# Patient Record
Sex: Female | Born: 1955 | Race: White | Hispanic: No | Marital: Married | Smoking: Former smoker
Health system: Southern US, Community
[De-identification: ages and names within clinical notes are randomized; demographics above are authoritative.]

## PROBLEM LIST (undated history)

## (undated) DIAGNOSIS — K219 Gastro-esophageal reflux disease without esophagitis: Secondary | ICD-10-CM

## (undated) HISTORY — PX: PLACEMENT OF BREAST IMPLANTS: SHX6334

## (undated) HISTORY — PX: AUGMENTATION MAMMAPLASTY: SUR837

## (undated) HISTORY — DX: Gastro-esophageal reflux disease without esophagitis: K21.9

## (undated) HISTORY — PX: REMOVAL OF BILATERAL TISSUE EXPANDERS WITH PLACEMENT OF BILATERAL BREAST IMPLANTS: SHX6431

---

## 1998-10-08 ENCOUNTER — Other Ambulatory Visit: Admission: RE | Admit: 1998-10-08 | Discharge: 1998-10-08 | Payer: Self-pay | Admitting: Family Medicine

## 2001-06-28 ENCOUNTER — Other Ambulatory Visit: Admission: RE | Admit: 2001-06-28 | Discharge: 2001-06-28 | Payer: Self-pay | Admitting: *Deleted

## 2003-02-02 ENCOUNTER — Ambulatory Visit (HOSPITAL_COMMUNITY): Admission: RE | Admit: 2003-02-02 | Discharge: 2003-02-02 | Payer: Self-pay | Admitting: Obstetrics & Gynecology

## 2004-03-24 ENCOUNTER — Other Ambulatory Visit: Admission: RE | Admit: 2004-03-24 | Discharge: 2004-03-24 | Payer: Self-pay | Admitting: Obstetrics & Gynecology

## 2005-04-20 ENCOUNTER — Other Ambulatory Visit: Admission: RE | Admit: 2005-04-20 | Discharge: 2005-04-20 | Payer: Self-pay | Admitting: Obstetrics & Gynecology

## 2006-08-02 ENCOUNTER — Ambulatory Visit: Payer: Self-pay | Admitting: Vascular Surgery

## 2006-08-15 ENCOUNTER — Ambulatory Visit: Payer: Self-pay | Admitting: Gastroenterology

## 2006-08-27 ENCOUNTER — Ambulatory Visit: Payer: Self-pay | Admitting: Gastroenterology

## 2010-04-09 ENCOUNTER — Encounter: Payer: Self-pay | Admitting: Obstetrics & Gynecology

## 2010-06-29 ENCOUNTER — Other Ambulatory Visit: Payer: Self-pay | Admitting: Obstetrics & Gynecology

## 2010-06-29 DIAGNOSIS — N6310 Unspecified lump in the right breast, unspecified quadrant: Secondary | ICD-10-CM

## 2010-07-01 ENCOUNTER — Ambulatory Visit
Admission: RE | Admit: 2010-07-01 | Discharge: 2010-07-01 | Disposition: A | Discharge status: Home / Self Care | Payer: Managed Care, Other (non HMO) | Source: Ambulatory Visit | Attending: Obstetrics & Gynecology | Admitting: Obstetrics & Gynecology

## 2010-07-01 DIAGNOSIS — N6310 Unspecified lump in the right breast, unspecified quadrant: Secondary | ICD-10-CM

## 2010-08-26 ENCOUNTER — Other Ambulatory Visit: Payer: Self-pay | Admitting: Family Medicine

## 2010-08-26 ENCOUNTER — Ambulatory Visit
Admission: RE | Admit: 2010-08-26 | Discharge: 2010-08-26 | Disposition: A | Discharge status: Home / Self Care | Payer: Managed Care, Other (non HMO) | Source: Ambulatory Visit | Attending: Family Medicine | Admitting: Family Medicine

## 2010-08-26 DIAGNOSIS — M79672 Pain in left foot: Secondary | ICD-10-CM

## 2010-11-15 ENCOUNTER — Other Ambulatory Visit: Payer: Self-pay | Admitting: Family Medicine

## 2010-11-16 ENCOUNTER — Ambulatory Visit
Admission: RE | Admit: 2010-11-16 | Discharge: 2010-11-16 | Disposition: A | Discharge status: Home / Self Care | Payer: Managed Care, Other (non HMO) | Source: Ambulatory Visit | Attending: Family Medicine | Admitting: Family Medicine

## 2010-11-29 ENCOUNTER — Encounter (INDEPENDENT_AMBULATORY_CARE_PROVIDER_SITE_OTHER): Payer: Self-pay | Admitting: General Surgery

## 2010-12-01 ENCOUNTER — Encounter (INDEPENDENT_AMBULATORY_CARE_PROVIDER_SITE_OTHER): Payer: Self-pay | Admitting: General Surgery

## 2010-12-01 ENCOUNTER — Ambulatory Visit (INDEPENDENT_AMBULATORY_CARE_PROVIDER_SITE_OTHER): Payer: Commercial Indemnity | Admitting: General Surgery

## 2010-12-01 VITALS — BP 106/78 | HR 60 | Temp 97.3°F | Ht 63.0 in | Wt 111.4 lb

## 2010-12-01 DIAGNOSIS — K8020 Calculus of gallbladder without cholecystitis without obstruction: Secondary | ICD-10-CM

## 2010-12-01 NOTE — Progress Notes (Signed)
Chief Complaint  Patient presents with  . Other    new pt- eval of GB with possible polyp or stone     HPI Sarah Haas is a 55 y.o. female.  This patient was referred by Dr. Paulino Rily for evaluation of her quadrant pain and an ultrasound which demonstrated a 6 mm polyp or gallstone. She states that she was feeling fine and usual state of health until approximately 3 weeks ago when she had an acute episode of right upper quadrant pain. She had an episode of discomfort beginning immediately after eating [employer] with chips and she began having pain in her right lateral abdomen and ribs which lasted approximately 5 hours, no radiation. She states that her she did not have any associated nausea, vomiting, fevers, or chills. She was able to finally go to sleep at approximately 2:30 in the morning when she woke up her pain was better. She was seen in followup by her primary physician who ordered an ultrasound and laboratory studies her labs were normal but ultrasound demonstrated a 6 mm polyp versus an adherent gallstone.  HPI  Past Medical History  Diagnosis Date  . Abdominal pain     History reviewed. No pertinent past surgical history.  History reviewed. No pertinent family history.  Social History History  Substance Use Topics  . Smoking status: Former Games developer  . Smokeless tobacco: Not on file   Comment: quit in 2002  . Alcohol Use: Yes    No Known Allergies  Current Outpatient Prescriptions  Medication Sig Dispense Refill  . aspirin 81 MG tablet Take 81 mg by mouth daily.        . calcium carbonate (OS-CAL) 600 MG TABS Take 600 mg by mouth 2 (two) times daily with a meal.        . Multiple Vitamin (MULTIVITAMIN) capsule Take 1 capsule by mouth daily.        . Omega-3 Fatty Acids (FISH OIL) 1200 MG CAPS Take by mouth.          Review of Systems Review of Systems  All other systems reviewed and are negative.    Blood pressure 106/78, pulse 60, temperature 97.3 F (36.3 C),  height 5\' 3"  (1.6 m), weight 111 lb 6 oz (50.519 kg).  Physical Exam Physical Exam  Vitals reviewed. Constitutional: She is oriented to person, place, and time. She appears well-developed and well-nourished. No distress.  HENT:  Head: Normocephalic and atraumatic.  Eyes: Conjunctivae and EOM are normal. Pupils are equal, round, and reactive to light. Right eye exhibits no discharge. Left eye exhibits no discharge. No scleral icterus.  Neck: Normal range of motion. Neck supple. No tracheal deviation present.  Cardiovascular: Normal rate, regular rhythm and normal heart sounds.   Pulmonary/Chest: Effort normal and breath sounds normal. No stridor. No respiratory distress. She has no wheezes.  Abdominal: Soft. Bowel sounds are normal. She exhibits no distension and no mass. There is no tenderness. There is no rebound and no guarding.  Musculoskeletal: Normal range of motion. She exhibits no edema.  Neurological: She is alert and oriented to person, place, and time.  Skin: Skin is warm and dry. No rash noted. She is not diaphoretic. No erythema. No pallor.  Psychiatric: She has a normal mood and affect. Her behavior is normal. Judgment and thought content normal.    Data Reviewed   Assessment/Plan    Abdominal pain and a gallbladder polyp versus cholelithiasis. Her symptoms certainly do sound as though they could be  attributed to cholelithiasis.  This may be a polyp or a gallstone. If it is a polyp, then at 6 mm in size, it would not require surgical excision at this time since the likelihood of malignancy is low. More commonly, this would be a nonmobile gallstone. I offered laparoscopic cholecystectomy, single incision cholecystectomy, or continued observation. We did discuss the risks of cholecystitis, cholangitis, and pancreatitis and she expressed understanding. She is not sure if she would like to have surgical removal at this time and I gave her some information about gallbladder surgery  and she will take about this and discuss with her husband and she will call me back if she desires to proceed with cholecystectomy. If she desires not to have surgery at this time, then I would recommend repeat ultrasound in 6 months to ensure stability of the gallbladder polyp. If there is growth of the polyp at 6 months time, then I would consider cholecystectomy for diagnosis of that.               Lodema Pilot DAVID 12/01/2010, 5:46 PM

## 2010-12-28 ENCOUNTER — Other Ambulatory Visit: Payer: Self-pay | Admitting: Obstetrics & Gynecology

## 2012-02-13 ENCOUNTER — Ambulatory Visit: Payer: Self-pay | Admitting: Gastroenterology

## 2012-03-01 ENCOUNTER — Other Ambulatory Visit: Payer: Self-pay | Admitting: Gastroenterology

## 2012-03-01 ENCOUNTER — Ambulatory Visit (INDEPENDENT_AMBULATORY_CARE_PROVIDER_SITE_OTHER): Payer: 59 | Admitting: Gastroenterology

## 2012-03-01 ENCOUNTER — Encounter: Payer: Self-pay | Admitting: Gastroenterology

## 2012-03-01 ENCOUNTER — Other Ambulatory Visit (INDEPENDENT_AMBULATORY_CARE_PROVIDER_SITE_OTHER): Payer: 59

## 2012-03-01 VITALS — BP 92/62 | HR 78 | Ht 63.5 in | Wt 109.0 lb

## 2012-03-01 DIAGNOSIS — R1319 Other dysphagia: Secondary | ICD-10-CM

## 2012-03-01 DIAGNOSIS — K3189 Other diseases of stomach and duodenum: Secondary | ICD-10-CM

## 2012-03-01 DIAGNOSIS — R1314 Dysphagia, pharyngoesophageal phase: Secondary | ICD-10-CM

## 2012-03-01 DIAGNOSIS — R109 Unspecified abdominal pain: Secondary | ICD-10-CM

## 2012-03-01 DIAGNOSIS — R195 Other fecal abnormalities: Secondary | ICD-10-CM

## 2012-03-01 DIAGNOSIS — K30 Functional dyspepsia: Secondary | ICD-10-CM

## 2012-03-01 DIAGNOSIS — K219 Gastro-esophageal reflux disease without esophagitis: Secondary | ICD-10-CM

## 2012-03-01 DIAGNOSIS — R1310 Dysphagia, unspecified: Secondary | ICD-10-CM

## 2012-03-01 DIAGNOSIS — R1012 Left upper quadrant pain: Secondary | ICD-10-CM

## 2012-03-01 LAB — TSH: TSH: 2.45 u[IU]/mL (ref 0.35–5.50)

## 2012-03-01 LAB — CBC WITH DIFFERENTIAL/PLATELET
Basophils Absolute: 0 10*3/uL (ref 0.0–0.1)
Basophils Relative: 1.2 % (ref 0.0–3.0)
Eosinophils Absolute: 0.1 10*3/uL (ref 0.0–0.7)
Eosinophils Relative: 3.7 % (ref 0.0–5.0)
HCT: 40.8 % (ref 36.0–46.0)
Hemoglobin: 14.1 g/dL (ref 12.0–15.0)
Lymphocytes Relative: 40.9 % (ref 12.0–46.0)
Lymphs Abs: 1.6 10*3/uL (ref 0.7–4.0)
MCHC: 34.6 g/dL (ref 30.0–36.0)
MCV: 93.6 fl (ref 78.0–100.0)
Monocytes Absolute: 0.4 10*3/uL (ref 0.1–1.0)
Monocytes Relative: 11.2 % (ref 3.0–12.0)
Neutro Abs: 1.7 10*3/uL (ref 1.4–7.7)
Neutrophils Relative %: 43 % (ref 43.0–77.0)
Platelets: 274 10*3/uL (ref 150.0–400.0)
RBC: 4.35 Mil/uL (ref 3.87–5.11)
RDW: 12.9 % (ref 11.5–14.6)
WBC: 3.9 10*3/uL — ABNORMAL LOW (ref 4.5–10.5)

## 2012-03-01 LAB — BASIC METABOLIC PANEL
BUN: 22 mg/dL (ref 6–23)
CO2: 28 mEq/L (ref 19–32)
Calcium: 9.5 mg/dL (ref 8.4–10.5)
Chloride: 104 mEq/L (ref 96–112)
Creatinine, Ser: 0.7 mg/dL (ref 0.4–1.2)
GFR: 91.97 mL/min (ref >60.00)
Glucose, Bld: 79 mg/dL (ref 70–99)
Potassium: 4.5 mEq/L (ref 3.5–5.1)
Sodium: 138 mEq/L (ref 135–145)

## 2012-03-01 LAB — HEPATIC FUNCTION PANEL
ALT: 20 U/L (ref 0–35)
AST: 22 U/L (ref 0–37)
Albumin: 4.3 g/dL (ref 3.5–5.2)
Alkaline Phosphatase: 71 U/L (ref 39–117)
Bilirubin, Direct: 0.1 mg/dL (ref 0.0–0.3)
Total Bilirubin: 0.7 mg/dL (ref 0.3–1.2)
Total Protein: 6.9 g/dL (ref 6.0–8.3)

## 2012-03-01 LAB — IBC PANEL
Iron: 268 ug/dL — ABNORMAL HIGH (ref 42–145)
Saturation Ratios: 53 % — ABNORMAL HIGH (ref 20.0–50.0)
Transferrin: 361.1 mg/dL — ABNORMAL HIGH (ref 212.0–360.0)

## 2012-03-01 LAB — FOLATE: Folate: >24.8 ng/mL (ref >5.9)

## 2012-03-01 LAB — VITAMIN B12: Vitamin B-12: 674 pg/mL (ref 211–911)

## 2012-03-01 LAB — FERRITIN: Ferritin: 14.2 ng/mL (ref 10.0–291.0)

## 2012-03-01 MED ORDER — ESOMEPRAZOLE MAGNESIUM 40 MG PO CPDR: 40.0000 mg | DELAYED_RELEASE_CAPSULE | Freq: Every day | ORAL | Status: DC

## 2012-03-01 MED ORDER — HYOSCYAMINE SULFATE 0.125 MG SL SUBL: 0.1250 mg | SUBLINGUAL_TABLET | SUBLINGUAL | Status: DC | PRN

## 2012-03-01 MED ORDER — MOVIPREP 100 G PO SOLR: 1.0000 | Freq: Once | ORAL | Status: DC

## 2012-03-01 NOTE — Progress Notes (Signed)
History of Present Illness:  This is a very pleasant 56 year old Caucasian female who has had guaiac positive stools on Hemoccult card testing.  She denies any GI complaints has regular bowel movements without melena or hematochezia.  Last colonoscopy was unremarkable in June of 2008.  The patient does have chronic dyspepsia, some reflux symptoms, occasional intermittent solid food dysphagia.  She has not had previous endoscopic exam or upper GI series.  She has been told in the past that she had nonspecific dyspepsia, and apparently this is exacerbated by stressful conditions.  IBS seems to run her family.  She also complains of intermittent left upper quadrant pain may be one to 2 times a week without real precipitating or alleviating elements.  She apparently has known gallstones, but denies any hepatobiliary or systemic complaints.  There is no history of NSAID, cigarette, or alcohol abuse.  There is no past history of hepatitis or pancreatitis.  She does work as an Chief Executive Officer.  I have reviewed notes from Julio Sicks, [facility] Practitioner [facility].  The patient has had recent normal pelvic examination that was reviewed.  Ultrasound exam from last year also reviewed today.  I have reviewed this patient's present history, medical and surgical past history, allergies and medications.     ROS: The remainder of the 10 point ROS is negative.... no history of Raynaud's phenomena, or any other symptoms of collagen vascular disease.  No Known Allergies Outpatient Prescriptions Prior to Visit  Medication Sig Dispense Refill  . calcium carbonate (OS-CAL) 600 MG TABS Take 600 mg by mouth 2 (two) times daily with a meal.        . Multiple Vitamin (MULTIVITAMIN) capsule Take 1 capsule by mouth daily.        . Omega-3 Fatty Acids (FISH OIL) 1200 MG CAPS Take by mouth.        . [DISCONTINUED] aspirin 81 MG tablet Take 81 mg by mouth daily.         Last reviewed on 03/01/2012  11:17 AM by Mardella Layman, MD Past Medical History  Diagnosis Date  . Abdominal pain   . GERD (gastroesophageal reflux disease)    Past Surgical History  Procedure Date  . Cesarean section    History   Social History  . Marital Status: Married    Spouse Name: N/A    Number of Children: 2  . Years of Education: N/A   Occupational History  . orthodonic assistant    Social History Main Topics  . Smoking status: Former Smoker    Types: Cigarettes    Quit date: 03/20/2001  . Smokeless tobacco: Never Used     Comment: quit in 2002  . Alcohol Use: Yes     Comment: social  . Drug Use: No  . Sexually Active: None   Other Topics Concern  . None   Social History Narrative  . None   Family History  Problem Relation Age of Onset  . Heart disease Father         Physical Exam: Healthy patient in no distress.  Blood pressure 92/62, pulse 68 and regular, weight 109 pounds with a BMI of 19.01.  98% oxygen saturation at room air. General well developed well nourished patient in no acute distress, appearing their stated age Eyes PERRLA, no icterus, fundoscopic exam per opthamologist Skin no lesions noted Neck supple, no adenopathy, no thyroid enlargement, no tenderness Chest clear to percussion and auscultation Heart no significant murmurs,  gallops or rubs noted Abdomen no hepatosplenomegaly masses or tenderness, BS normal.  Extremities no acute joint lesions, edema, phlebitis or evidence of cellulitis. Neurologic patient oriented x 3, cranial nerves intact, no focal neurologic deficits noted. Psychological mental status normal and normal affect.  Assessment and plan: Review of this patient's ultrasound shows a probable gallbladder polyp.  She certainly does not seem to have symptomatic cholelithiasis at this time.  She does have acid reflux symptoms and a possible esophageal stricture with intermittent dysphagia.  I have scheduled endoscopy with possible dilatation, also  exam for H. pylori infection.  I have placed her on Nexium 40 mg a day 30 minutes before the first meal of the day with when necessary sublingual Levsin for left upper quadrant spasms.  I am concerned about her guaiac positive stool, and also we'll recheck her colonoscopy exam with propofol sedation.  CBC and anemia profile labs drawn.   Cc Julio Sicks, nurse practitioner  Encounter Diagnoses  Name Primary?  . GERD (gastroesophageal reflux disease) Yes  . Heme positive stool   . Abdominal pain

## 2012-03-01 NOTE — Patient Instructions (Addendum)
You have been scheduled for an endoscopy and colonoscopy with propofol. Please follow the written instructions given to you at your visit today. Please pick up your prep at the pharmacy within the next 1-3 days. If you use inhalers (even only as needed) or a CPAP machine, please bring them with you on the day of your procedure.  We have sent the following medications to your pharmacy for you to pick up at your convenience: Nexium, please take one capsule by mouth thirty minutes before breakfast daily. AND Levsin, please take as directed.  Your physician has requested that you go to the basement for lab work before leaving today:

## 2012-03-04 LAB — CELIAC PANEL 10
Endomysial Screen: NEGATIVE
Gliadin IgA: 2 U/mL (ref <20)
Gliadin IgG: 2.3 U/mL (ref <20)
IgA: 74 mg/dL (ref 69–380)
Tissue Transglut Ab: 4.3 U/mL (ref <20)
Tissue Transglutaminase Ab, IgA: 1.3 U/mL (ref <20)

## 2012-03-18 ENCOUNTER — Ambulatory Visit (AMBULATORY_SURGERY_CENTER): Payer: 59 | Admitting: Gastroenterology

## 2012-03-18 ENCOUNTER — Other Ambulatory Visit: Payer: 59

## 2012-03-18 ENCOUNTER — Encounter: Payer: Self-pay | Admitting: Gastroenterology

## 2012-03-18 ENCOUNTER — Other Ambulatory Visit: Payer: Self-pay | Admitting: *Deleted

## 2012-03-18 VITALS — BP 103/67 | HR 58 | Temp 99.0°F | Resp 15 | Ht 63.0 in | Wt 109.0 lb

## 2012-03-18 DIAGNOSIS — R195 Other fecal abnormalities: Secondary | ICD-10-CM

## 2012-03-18 DIAGNOSIS — R1314 Dysphagia, pharyngoesophageal phase: Secondary | ICD-10-CM

## 2012-03-18 DIAGNOSIS — R1012 Left upper quadrant pain: Secondary | ICD-10-CM

## 2012-03-18 MED ORDER — SODIUM CHLORIDE 0.9 % IV SOLN: 500.0000 mL | INTRAVENOUS | Status: DC

## 2012-03-18 NOTE — Op Note (Signed)
[facility] [address] [place], [address]   ENDOSCOPY PROCEDURE REPORT  PATIENT: Sarah, Haas  MR#: [id number] BIRTHDATE: [birth year] , 56  yrs. old GENDER: Female ENDOSCOPIST:Dymphna Wadley Hale Bogus, MD, [facility] REFERRED BY: Roderick Pee, M.D. PROCEDURE DATE:  03/18/2012 PROCEDURE:   EGD, diagnostic ASA CLASS:    Class II INDICATIONS: Occult blood positive. MEDICATION: There was residual sedation effect present from prior procedure, Propofol (Diprivan) 150 mg IV, and Glycopyrrolate (Robinul) 0.2 mg IV TOPICAL ANESTHETIC:   Cetacaine Spray  DESCRIPTION OF PROCEDURE:   After the risks and benefits of the procedure were explained, informed consent was obtained.  The FUSE Demo Scope  endoscope was introduced through the mouth  and advanced to the second portion of the duodenum .  The instrument was slowly withdrawn as the mucosa was fully examined.No heme noted in UGI tract.    Images taken but only available in hard copy form that will be scanned into EP[employer]).    Retroflexed views revealed no abnormalities.    The scope was then withdrawn from the patient and the procedure completed.  COMPLICATIONS: There were no complications.   ENDOSCOPIC IMPRESSION:NORMAL EXAM..Marland Kitchen?? FALSE + STOO;  RECOMMENDATIONS:CONSIDER PILL CAMERA EXAM..Marland KitchenIRON STUDIES SHOW ELEVATED LEVELS.Marland KitchenWILL DO HEMACHROMATOSIS GENETIC STUDIES... Continue current medications    _______________________________ eSigned:  Mardella Layman, MD, [facility] 03/18/2012 4:24 PM   standard discharge

## 2012-03-18 NOTE — Op Note (Signed)
[facility] [address] [place], [address]   COLONOSCOPY PROCEDURE REPORT  PATIENT: Sarah Haas, Sarah Haas  MR#: [id number] BIRTHDATE: [birth year] , 56  yrs. old GENDER: Female ENDOSCOPIST: Mardella Layman, MD, [facility] REFERRED BY: PROCEDURE DATE:  03/18/2012 PROCEDURE:   Colonoscopy, screening ASA CLASS:   Class II INDICATIONS:heme-positive stool. MEDICATIONS: Propofol (Diprivan) 320 mg IV  DESCRIPTION OF PROCEDURE:   After the risks and benefits and of the procedure were explained, informed consent was obtained.  A digital rectal exam revealed no abnormalities of the rectum.    The Fuse-Demo-Scope  endoscope was introduced through the anus and advanced to the    .  The quality of the prep was adequate, using MoviPrep .  The instrument was then slowly withdrawn as the colon was fully examined.     COLON FINDINGS: The colon was redundant.  Manual abdominal counter-pressure was used to reach the cecum.   A normal appearing cecum, ileocecal valve, and appendiceal orifice were identified. The ascending, hepatic flexure, transverse, splenic flexure, descending, sigmoid colon and rectum appeared unremarkable.  No polyps or cancers were seen.            The scope was then withdrawn from the patient and the procedure completed.  COMPLICATIONS: There were no complications. ENDOSCOPIC IMPRESSION: The colon was redundant ...no polyps noted.Marland KitchenMarland KitchenNo bleeding,heme noted...  RECOMMENDATIONS: Upper endoscopy will be scheduled   REPEAT EXAM:  XB:[id number] Shawnie Dapper, MD  _______________________________ eSigned:  Mardella Layman, MD, [facility] 03/18/2012 4:17 PM

## 2012-03-18 NOTE — Patient Instructions (Addendum)
Discharge instructions given with verbal understanding. Resume previous medications. YOU HAD AN ENDOSCOPIC PROCEDURE TODAY AT THE [facility]: Refer to the procedure report that was given to you for any specific questions about what was found during the examination.  If the procedure report does not answer your questions, please call your gastroenterologist to clarify.  If you requested that your care partner not be given the details of your procedure findings, then the procedure report has been included in a sealed envelope for you to review at your convenience later.  YOU SHOULD EXPECT: Some feelings of bloating in the abdomen. Passage of more gas than usual.  Walking can help get rid of the air that was put into your GI tract during the procedure and reduce the bloating. If you had a lower endoscopy (such as a colonoscopy or flexible sigmoidoscopy) you may notice spotting of blood in your stool or on the toilet paper. If you underwent a bowel prep for your procedure, then you may not have a normal bowel movement for a few days.  DIET: Your first meal following the procedure should be a light meal and then it is ok to progress to your normal diet.  A half-sandwich or bowl of soup is an example of a good first meal.  Heavy or fried foods are harder to digest and may make you feel nauseous or bloated.  Likewise meals heavy in dairy and vegetables can cause extra gas to form and this can also increase the bloating.  Drink plenty of fluids but you should avoid alcoholic beverages for 24 hours.  ACTIVITY: Your care partner should take you home directly after the procedure.  You should plan to take it easy, moving slowly for the rest of the day.  You can resume normal activity the day after the procedure however you should NOT DRIVE or use heavy machinery for 24 hours (because of the sedation medicines used during the test).    SYMPTOMS TO REPORT IMMEDIATELY: A gastroenterologist can be reached  at any hour.  During normal business hours, 8:30 AM to 5:00 PM Monday through Friday, call [phone].  After hours and on weekends, please call the GI answering service at [phone] who will take a message and have the physician on call contact you.   Following lower endoscopy (colonoscopy or flexible sigmoidoscopy):  Excessive amounts of blood in the stool  Significant tenderness or worsening of abdominal pains  Swelling of the abdomen that is new, acute  Fever of 100F or higher  Following upper endoscopy (EGD)  Vomiting of blood or coffee ground material  New chest pain or pain under the shoulder blades  Painful or persistently difficult swallowing  New shortness of breath  Fever of 100F or higher  Black, tarry-looking stools  FOLLOW UP: If any biopsies were taken you will be contacted by phone or by letter within the next 1-3 weeks.  Call your gastroenterologist if you have not heard about the biopsies in 3 weeks.  Our staff will call the home number listed on your records the next business day following your procedure to check on you and address any questions or concerns that you may have at that time regarding the information given to you following your procedure. This is a courtesy call and so if there is no answer at the home number and we have not heard from you through the emergency physician on call, we will assume that you have returned to your regular daily  activities without incident.  SIGNATURES/CONFIDENTIALITY: You and/or your care partner have signed paperwork which will be entered into your electronic medical record.  These signatures attest to the fact that that the information above on your After Visit Summary has been reviewed and is understood.  Full responsibility of the confidentiality of this discharge information lies with you and/or your care-partner.

## 2012-03-18 NOTE — Progress Notes (Signed)
Patient did not experience any of the following events: a burn prior to discharge; a fall within the facility; wrong site/side/patient/procedure/implant event; or a hospital transfer or hospital admission upon discharge from the facility. (G8907) Patient did not have preoperative order for IV antibiotic SSI prophylaxis. (G8918)  

## 2012-03-19 ENCOUNTER — Telehealth: Payer: Self-pay

## 2012-03-19 NOTE — Telephone Encounter (Signed)
Left a message on the pt's answering machine at [phone] for the pt to call if any questions or concerns. Maw

## 2012-03-21 ENCOUNTER — Encounter: Payer: Self-pay | Admitting: Gastroenterology

## 2012-03-22 LAB — HEMOCHROMATOSIS DNA-PCR(C282Y,H63D)

## 2012-05-04 ENCOUNTER — Other Ambulatory Visit: Payer: Self-pay

## 2013-01-23 ENCOUNTER — Other Ambulatory Visit: Payer: Self-pay

## 2014-01-02 ENCOUNTER — Other Ambulatory Visit: Payer: Self-pay

## 2014-04-03 ENCOUNTER — Other Ambulatory Visit: Payer: Self-pay | Admitting: Obstetrics and Gynecology

## 2014-04-06 LAB — CYTOLOGY - PAP

## 2014-04-07 ENCOUNTER — Other Ambulatory Visit: Payer: Self-pay | Admitting: Family Medicine

## 2014-04-07 DIAGNOSIS — K8020 Calculus of gallbladder without cholecystitis without obstruction: Secondary | ICD-10-CM

## 2014-04-08 ENCOUNTER — Ambulatory Visit
Admission: RE | Admit: 2014-04-08 | Discharge: 2014-04-08 | Disposition: A | Discharge status: Home / Self Care | Payer: Managed Care, Other (non HMO) | Source: Ambulatory Visit | Attending: Family Medicine | Admitting: Family Medicine

## 2014-04-08 DIAGNOSIS — K8020 Calculus of gallbladder without cholecystitis without obstruction: Secondary | ICD-10-CM

## 2017-02-19 ENCOUNTER — Other Ambulatory Visit: Payer: Self-pay | Admitting: Family Medicine

## 2017-02-19 DIAGNOSIS — R590 Localized enlarged lymph nodes: Secondary | ICD-10-CM

## 2017-02-22 ENCOUNTER — Ambulatory Visit
Admission: RE | Admit: 2017-02-22 | Discharge: 2017-02-22 | Disposition: A | Discharge status: Home / Self Care | Payer: Managed Care, Other (non HMO) | Source: Ambulatory Visit | Attending: Family Medicine | Admitting: Family Medicine

## 2017-02-22 ENCOUNTER — Other Ambulatory Visit: Payer: Managed Care, Other (non HMO)

## 2017-02-22 DIAGNOSIS — R590 Localized enlarged lymph nodes: Secondary | ICD-10-CM

## 2017-09-05 ENCOUNTER — Other Ambulatory Visit: Payer: Self-pay | Admitting: Family Medicine

## 2017-09-05 ENCOUNTER — Ambulatory Visit
Admission: RE | Admit: 2017-09-05 | Discharge: 2017-09-05 | Disposition: A | Discharge status: Home / Self Care | Payer: Managed Care, Other (non HMO) | Source: Ambulatory Visit | Attending: Family Medicine | Admitting: Family Medicine

## 2017-09-05 DIAGNOSIS — R0602 Shortness of breath: Secondary | ICD-10-CM

## 2017-09-05 DIAGNOSIS — R5383 Other fatigue: Secondary | ICD-10-CM

## 2017-09-07 ENCOUNTER — Other Ambulatory Visit (HOSPITAL_COMMUNITY): Payer: Self-pay | Admitting: Family Medicine

## 2017-09-07 DIAGNOSIS — R0602 Shortness of breath: Secondary | ICD-10-CM

## 2017-09-10 ENCOUNTER — Other Ambulatory Visit (HOSPITAL_COMMUNITY): Payer: Managed Care, Other (non HMO)

## 2017-09-18 ENCOUNTER — Encounter (HOSPITAL_COMMUNITY): Payer: Self-pay | Admitting: Radiology

## 2018-12-03 ENCOUNTER — Other Ambulatory Visit: Payer: Self-pay | Admitting: Obstetrics & Gynecology

## 2018-12-03 DIAGNOSIS — T8543XA Leakage of breast prosthesis and implant, initial encounter: Secondary | ICD-10-CM

## 2018-12-06 ENCOUNTER — Other Ambulatory Visit: Payer: Managed Care, Other (non HMO)

## 2018-12-11 ENCOUNTER — Ambulatory Visit: Payer: Self-pay

## 2018-12-11 ENCOUNTER — Other Ambulatory Visit: Payer: Self-pay

## 2018-12-11 ENCOUNTER — Ambulatory Visit
Admission: RE | Admit: 2018-12-11 | Discharge: 2018-12-11 | Disposition: A | Discharge status: Home / Self Care | Payer: Managed Care, Other (non HMO) | Source: Ambulatory Visit | Attending: Obstetrics & Gynecology | Admitting: Obstetrics & Gynecology

## 2018-12-11 ENCOUNTER — Other Ambulatory Visit: Payer: Self-pay | Admitting: Obstetrics & Gynecology

## 2018-12-11 DIAGNOSIS — T8543XA Leakage of breast prosthesis and implant, initial encounter: Secondary | ICD-10-CM

## 2019-03-31 ENCOUNTER — Other Ambulatory Visit: Payer: Self-pay | Admitting: Family Medicine

## 2019-03-31 ENCOUNTER — Ambulatory Visit
Admission: RE | Admit: 2019-03-31 | Discharge: 2019-03-31 | Disposition: A | Discharge status: Home / Self Care | Payer: Managed Care, Other (non HMO) | Source: Ambulatory Visit | Attending: Family Medicine | Admitting: Family Medicine

## 2019-03-31 DIAGNOSIS — S93401A Sprain of unspecified ligament of right ankle, initial encounter: Secondary | ICD-10-CM

## 2019-04-14 ENCOUNTER — Other Ambulatory Visit: Payer: Self-pay | Admitting: Plastic Surgery

## 2019-05-07 ENCOUNTER — Other Ambulatory Visit: Payer: Self-pay

## 2019-05-07 ENCOUNTER — Emergency Department (HOSPITAL_COMMUNITY): Payer: Managed Care, Other (non HMO)

## 2019-05-07 ENCOUNTER — Emergency Department (HOSPITAL_COMMUNITY)
Admission: EM | Admit: 2019-05-07 | Discharge: 2019-05-08 | Disposition: A | Discharge status: Home / Self Care | Payer: Managed Care, Other (non HMO) | Attending: Emergency Medicine | Admitting: Emergency Medicine

## 2019-05-07 ENCOUNTER — Encounter (HOSPITAL_COMMUNITY): Payer: Self-pay | Admitting: Emergency Medicine

## 2019-05-07 DIAGNOSIS — R110 Nausea: Secondary | ICD-10-CM | POA: Insufficient documentation

## 2019-05-07 DIAGNOSIS — R0789 Other chest pain: Secondary | ICD-10-CM | POA: Diagnosis not present

## 2019-05-07 DIAGNOSIS — Z87891 Personal history of nicotine dependence: Secondary | ICD-10-CM | POA: Insufficient documentation

## 2019-05-07 DIAGNOSIS — R079 Chest pain, unspecified: Secondary | ICD-10-CM

## 2019-05-07 DIAGNOSIS — Z79899 Other long term (current) drug therapy: Secondary | ICD-10-CM | POA: Diagnosis not present

## 2019-05-07 MED ORDER — SODIUM CHLORIDE 0.9% FLUSH: 3.0000 mL | Freq: Once | INTRAVENOUS | Status: DC

## 2019-05-07 NOTE — ED Triage Notes (Signed)
Patient reports right lateral chest pain onset this evening , denies SOB , no nausea or diaphoresis .

## 2019-05-08 LAB — BASIC METABOLIC PANEL
Anion gap: 13 (ref 5–15)
BUN: 35 mg/dL — ABNORMAL HIGH (ref 8–23)
CO2: 23 mmol/L (ref 22–32)
Calcium: 8.9 mg/dL (ref 8.9–10.3)
Chloride: 105 mmol/L (ref 98–111)
Creatinine, Ser: 0.63 mg/dL (ref 0.44–1.00)
GFR calc Af Amer: >60 mL/min (ref >60)
GFR calc non Af Amer: >60 mL/min (ref >60)
Glucose, Bld: 138 mg/dL — ABNORMAL HIGH (ref 70–99)
Potassium: 3.7 mmol/L (ref 3.5–5.1)
Sodium: 141 mmol/L (ref 135–145)

## 2019-05-08 LAB — CBC
HCT: 40 % (ref 36.0–46.0)
Hemoglobin: 13.3 g/dL (ref 12.0–15.0)
MCH: 33 pg (ref 26.0–34.0)
MCHC: 33.3 g/dL (ref 30.0–36.0)
MCV: 99.3 fL (ref 80.0–100.0)
Platelets: 211 10*3/uL (ref 150–400)
RBC: 4.03 MIL/uL (ref 3.87–5.11)
RDW: 12.8 % (ref 11.5–15.5)
WBC: 4.3 10*3/uL (ref 4.0–10.5)
nRBC: 0 % (ref 0.0–0.2)

## 2019-05-08 LAB — PROTIME-INR
INR: 1 (ref 0.8–1.2)
Prothrombin Time: 13 seconds (ref 11.4–15.2)

## 2019-05-08 LAB — TROPONIN I (HIGH SENSITIVITY)
Troponin I (High Sensitivity): <2 ng/L (ref <18)
Troponin I (High Sensitivity): <2 ng/L (ref <18)

## 2019-05-08 NOTE — Discharge Instructions (Addendum)
Your tests are all normal, including labs, EKG and chest x-ray. You pain does not follow a cardiac type pain and your labs testing the heart are negative as well.   An ultrasound was offered to view the gall bladder, however, you have chosen to have this study done through your primary care provider, which is safe and appropriate.   Please return to the ED if you have new or worsening symptoms.

## 2019-05-08 NOTE — ED Provider Notes (Signed)
[facility] EMERGENCY DEPARTMENT Provider Note   CSN: [id number] Arrival date & time: 05/07/19  2310     History Chief Complaint  Patient presents with  . Chest Pain    Sarah Haas is a 64 y.o. female.  Patient to ED with complaint of right lower chest pain that started around 7:00 pm last night (05/07/19). She describes the pain as sharp, constant, intense and nonradiating. She did not experience SOB, diaphoresis. She had some nausea but feels it was when the pain was the most intense. No recent illness, cough, congestion, fever. She reports having this pain in the past once or twice. Since arrival to the ED the pain has resolved. Last meal was Lance Muss that included chips and a brownie.   The history is provided by the patient. No language interpreter was used.  Chest Pain Associated symptoms: nausea   Associated symptoms: no cough, no diaphoresis, no fever, no shortness of breath, no vomiting and no weakness        Past Medical History:  Diagnosis Date  . Abdominal pain   . GERD (gastroesophageal reflux disease)     There are no problems to display for this patient.   Past Surgical History:  Procedure Laterality Date  . AUGMENTATION MAMMAPLASTY Bilateral   . CESAREAN SECTION    . PLACEMENT OF BREAST IMPLANTS       OB History   No obstetric history on file.     Family History  Problem Relation Age of Onset  . Heart disease Father     Social History   Tobacco Use  . Smoking status: Former Smoker    Types: Cigarettes    Quit date: 03/20/2001    Years since quitting: 18.1  . Smokeless tobacco: Never Used  . Tobacco comment: quit in 2002  Substance Use Topics  . Alcohol use: Yes    Comment: social  . Drug use: No    Home Medications Prior to Admission medications   Medication Sig Start Date End Date Taking? Authorizing Provider  calcium carbonate (OS-CAL) 600 MG TABS Take 600 mg by mouth 2 (two) times daily with a meal.      [provider]  esomeprazole (NEXIUM) 40 MG capsule Take 1 capsule (40 mg total) by mouth daily before breakfast. 03/01/12   Mardella Layman, MD  hyoscyamine (LEVSIN SL) 0.125 MG SL tablet Place 1 tablet (0.125 mg total) under the tongue every 4 (four) hours as needed for cramping. 03/01/12   Mardella Layman, MD  Multiple Vitamin (MULTIVITAMIN) capsule Take 1 capsule by mouth daily.      [provider]  Omega-3 Fatty Acids (FISH OIL) 1200 MG CAPS Take by mouth.      [provider]    Allergies    Patient has no known allergies.  Review of Systems   Review of Systems  Constitutional: Negative for chills, diaphoresis and fever.  HENT: Negative.   Respiratory: Negative.  Negative for cough and shortness of breath.   Cardiovascular: Positive for chest pain.  Gastrointestinal: Positive for nausea. Negative for vomiting.  Musculoskeletal: Negative.   Skin: Negative.   Neurological: Negative.  Negative for weakness.    Physical Exam Updated Vital Signs BP 105/73   Pulse (!) 53   Temp 98.7 F (37.1 C) (Oral)   Resp 16   Ht 5\' 3"  (1.6 m)   Wt 47.6 kg   SpO2 98%   BMI 18.60 kg/m   Physical Exam  Vitals and nursing note reviewed.  Constitutional:      Appearance: She is well-developed.  HENT:     Head: Normocephalic.  Cardiovascular:     Rate and Rhythm: Normal rate and regular rhythm.     Heart sounds: No murmur.  Pulmonary:     Effort: Pulmonary effort is normal.     Breath sounds: No wheezing, rhonchi or rales.  Chest:     Chest wall: No tenderness.    Abdominal:     General: Bowel sounds are normal.     Palpations: Abdomen is soft.     Tenderness: There is no abdominal tenderness (Specifically, no RUQ tenderness.). There is no guarding or rebound.  Musculoskeletal:        General: Normal range of motion.     Cervical back: Normal range of motion and neck supple.  Skin:    General: Skin is warm and dry.     Findings: No rash.    Neurological:     Mental Status: She is alert and oriented to person, place, and time.     ED Results / Procedures / Treatments   Labs (all labs ordered are listed, but only abnormal results are displayed) Labs Reviewed  BASIC METABOLIC PANEL - Abnormal; Notable for the following components:      Result Value   Glucose, Bld 138 (*)    BUN 35 (*)    All other components within normal limits  CBC  PROTIME-INR  TROPONIN I (HIGH SENSITIVITY)  TROPONIN I (HIGH SENSITIVITY)    EKG EKG Interpretation  Date/Time:  Wednesday May 07 2019 23:22:06 EST Ventricular Rate:  65 PR Interval:  170 QRS Duration: 80 QT Interval:  404 QTC Calculation: 420 R Axis:   91 Text Interpretation: Normal sinus rhythm Rightward axis Borderline ECG No old tracing to compare Confirmed by Merrily Pew [phone]) on 05/08/2019 4:04:10 AM   Radiology DG Chest 2 View  Result Date: 05/07/2019 CLINICAL DATA:  Chest pain EXAM: CHEST - 2 VIEW COMPARISON:  09/05/2017 FINDINGS: The heart size and mediastinal contours are within normal limits. Both lungs are clear. The visualized skeletal structures are unremarkable. IMPRESSION: No active cardiopulmonary disease. Electronically Signed   By: Donavan Foil M.D.   On: 05/07/2019 23:35    Procedures Procedures (including critical care time)  Medications Ordered in ED Medications  sodium chloride flush (NS) 0.9 % injection 3 mL (has no administration in time range)    ED Course  I have reviewed the triage vital signs and the nursing notes.  Pertinent labs & imaging results that were available during my care of the patient were reviewed by me and considered in my medical decision making (see chart for details).    MDM Rules/Calculators/A&P                      Patient to ED with right sided intense chest pain that started around 7:00 last night and was constant for several hours before resolving.   Labs are unremarkable. A Bmet was performed by protocol  so liver function tests were not analyzed. Differential would include gall stones, however, no white count, fever, abdominal tenderness. Discussed obtaining an ultrasound tonight but patient opts to follow up with her doctor and have the study as an outpatient.   Doubt cardiac in origin as symptoms are atypical for angina, normal EKG, negative trop x 2.   She is felt appropriate for discharge home and is encouraged to see her  primary care physician as planned for further testing.  Final Clinical Impression(s) / ED Diagnoses Final diagnoses:  None   1. Right chest pain, nonspecific  Rx / DC Orders ED Discharge Orders    None       Elpidio Anis, PA-C 05/08/19 0443    Mesner, Barbara Cower, MD 05/08/19 [phone]

## 2019-05-08 NOTE — ED Notes (Addendum)
thr3 pt has had rt upper abd pain  Since 2100 nausea no vomiting.  Has had gb problems in the past  At present her pain has almost gone away

## 2019-06-16 ENCOUNTER — Ambulatory Visit: Payer: Managed Care, Other (non HMO) | Attending: Internal Medicine

## 2019-06-16 DIAGNOSIS — Z23 Encounter for immunization: Secondary | ICD-10-CM

## 2019-06-16 NOTE — Progress Notes (Signed)
   Covid-19 Vaccination Clinic  Name:  Sarah Haas    MRN: [id number] DOB: [birth year]  06/16/2019  [name] was observed post Covid-19 immunization for 15 minutes without incident. She was provided with Vaccine Information Sheet and instruction to access the V-Safe system.   [name] was instructed to call 911 with any severe reactions post vaccine: Marland Kitchen Difficulty breathing  . Swelling of face and throat  . A fast heartbeat  . A bad rash all over body  . Dizziness and weakness   Immunizations Administered    Name Date Dose VIS Date Route   Pfizer COVID-19 Vaccine 06/16/2019  2:49 PM 0.3 mL 02/28/2019 Intramuscular   Manufacturer: [employer], [employer]   Lot: [id number]   NDC: [address]-2

## 2019-07-09 ENCOUNTER — Ambulatory Visit: Payer: Managed Care, Other (non HMO) | Attending: Internal Medicine

## 2019-07-09 DIAGNOSIS — Z23 Encounter for immunization: Secondary | ICD-10-CM

## 2019-07-09 NOTE — Progress Notes (Signed)
   Covid-19 Vaccination Clinic  Name:  Ericah Scotto    MRN: [id number] DOB: [birth year]  07/09/2019  Ms. Wyne was observed post Covid-19 immunization for 15 minutes without incident. She was provided with Vaccine Information Sheet and instruction to access the V-Safe system.   Ms. Veals was instructed to call 911 with any severe reactions post vaccine: Marland Kitchen Difficulty breathing  . Swelling of face and throat  . A fast heartbeat  . A bad rash all over body  . Dizziness and weakness   Immunizations Administered    Name Date Dose VIS Date Route   Pfizer COVID-19 Vaccine 07/09/2019  3:33 PM 0.3 mL 05/14/2018 Intramuscular   Manufacturer: [employer], [employer]   Lot: [id number]   NDC: [address]-1

## 2020-02-09 ENCOUNTER — Other Ambulatory Visit: Payer: Self-pay | Admitting: Surgery

## 2020-02-09 DIAGNOSIS — K8050 Calculus of bile duct without cholangitis or cholecystitis without obstruction: Secondary | ICD-10-CM

## 2020-02-10 ENCOUNTER — Ambulatory Visit
Admission: RE | Admit: 2020-02-10 | Discharge: 2020-02-10 | Disposition: A | Discharge status: Home / Self Care | Payer: Managed Care, Other (non HMO) | Source: Ambulatory Visit | Attending: Surgery | Admitting: Surgery

## 2020-02-10 DIAGNOSIS — K8050 Calculus of bile duct without cholangitis or cholecystitis without obstruction: Secondary | ICD-10-CM

## 2021-02-15 DIAGNOSIS — K760 Fatty (change of) liver, not elsewhere classified: Secondary | ICD-10-CM | POA: Diagnosis not present

## 2021-02-15 DIAGNOSIS — K8020 Calculus of gallbladder without cholecystitis without obstruction: Secondary | ICD-10-CM | POA: Diagnosis not present

## 2021-02-16 ENCOUNTER — Other Ambulatory Visit: Payer: Self-pay | Admitting: Family Medicine

## 2021-02-16 ENCOUNTER — Other Ambulatory Visit (HOSPITAL_COMMUNITY): Payer: Self-pay | Admitting: Family Medicine

## 2021-02-16 DIAGNOSIS — K8020 Calculus of gallbladder without cholecystitis without obstruction: Secondary | ICD-10-CM

## 2021-02-16 DIAGNOSIS — R1011 Right upper quadrant pain: Secondary | ICD-10-CM

## 2021-03-22 ENCOUNTER — Ambulatory Visit (HOSPITAL_COMMUNITY): Payer: PPO

## 2021-03-22 ENCOUNTER — Encounter (HOSPITAL_COMMUNITY): Payer: Self-pay

## 2021-04-17 IMAGING — MG MM  DIGITAL DIAGNOSTIC BREAST BILAT IMPLANT W/ TOMO W/ CAD
9 of 12 series · 9 of 28 positions shown · non-contrast
Comparison: Previous exam(s).

CLINICAL DATA: 62-year-old female with nonfocal left breast pain.

EXAM:
2D DIGITAL DIAGNOSTIC BILATERAL MAMMOGRAM WITH IMPLANTS, CAD AND
ADJUNCT TOMO
The patient has retropectoral saline implants. Standard and implant
displaced views were performed.

[R MLO]
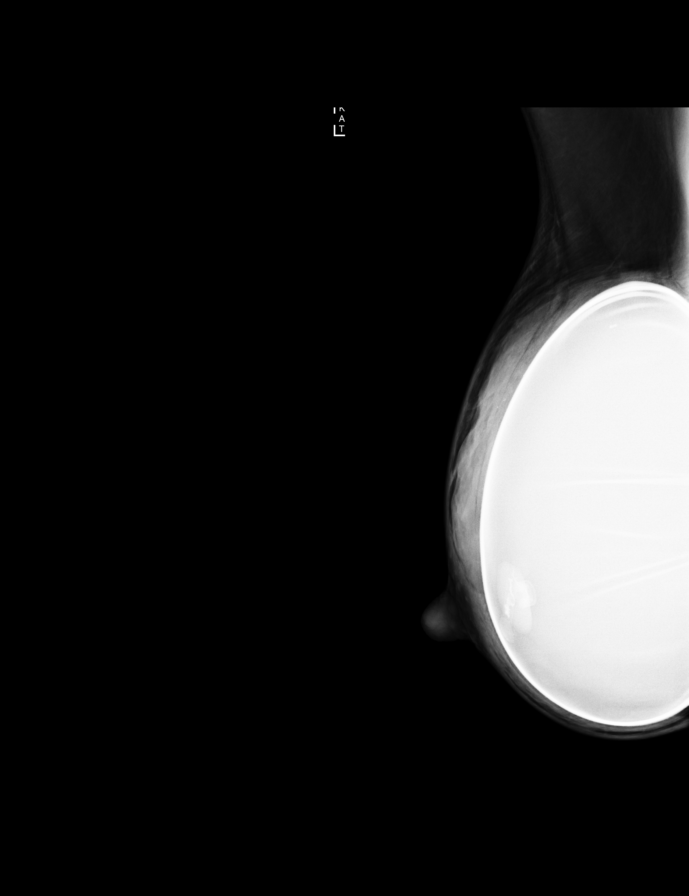

[R CC]
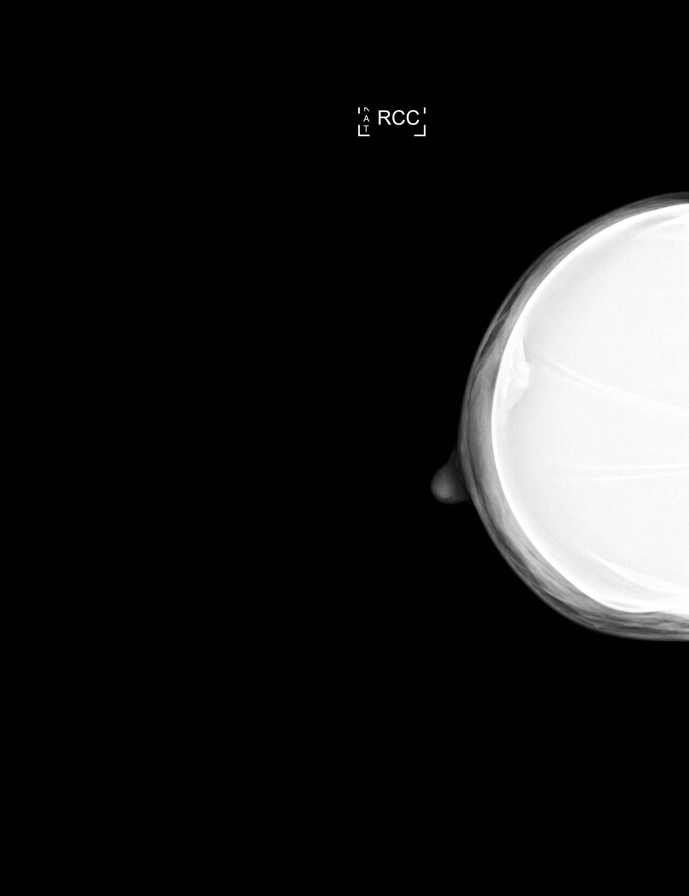

[L MLO]
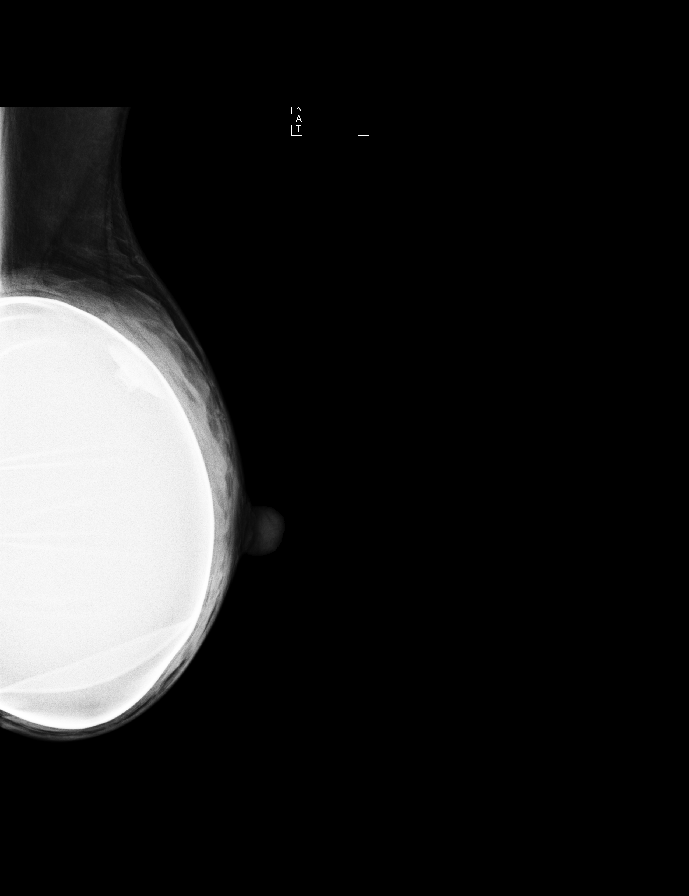

[L CC]
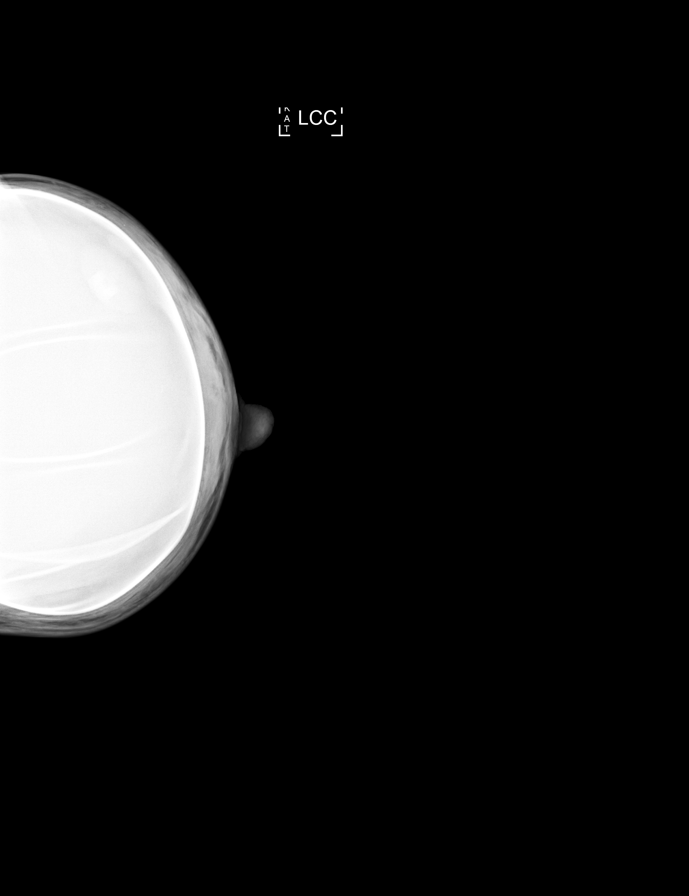

[R MLO synth-2D]
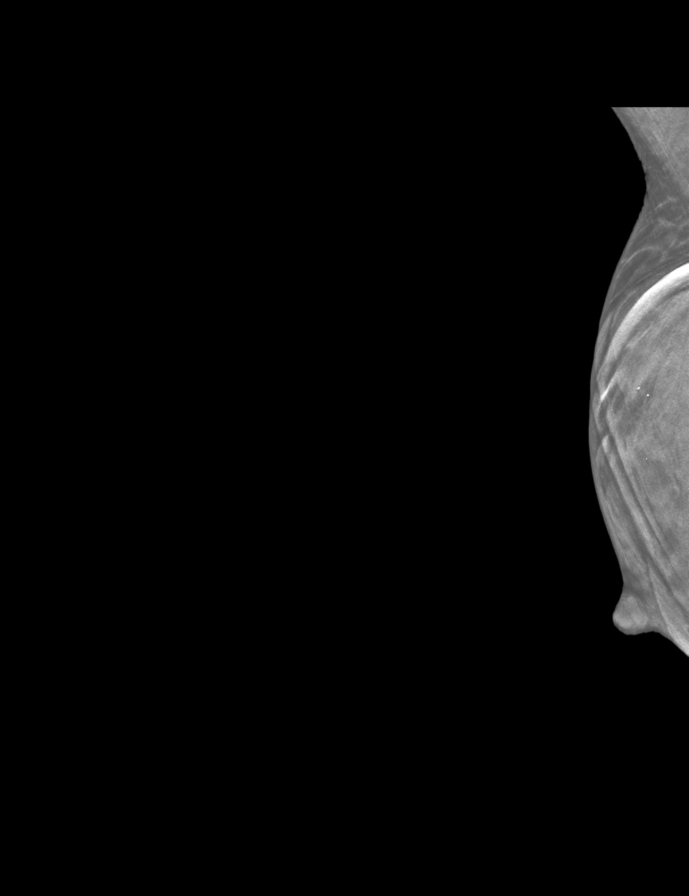

[L CC synth-2D]
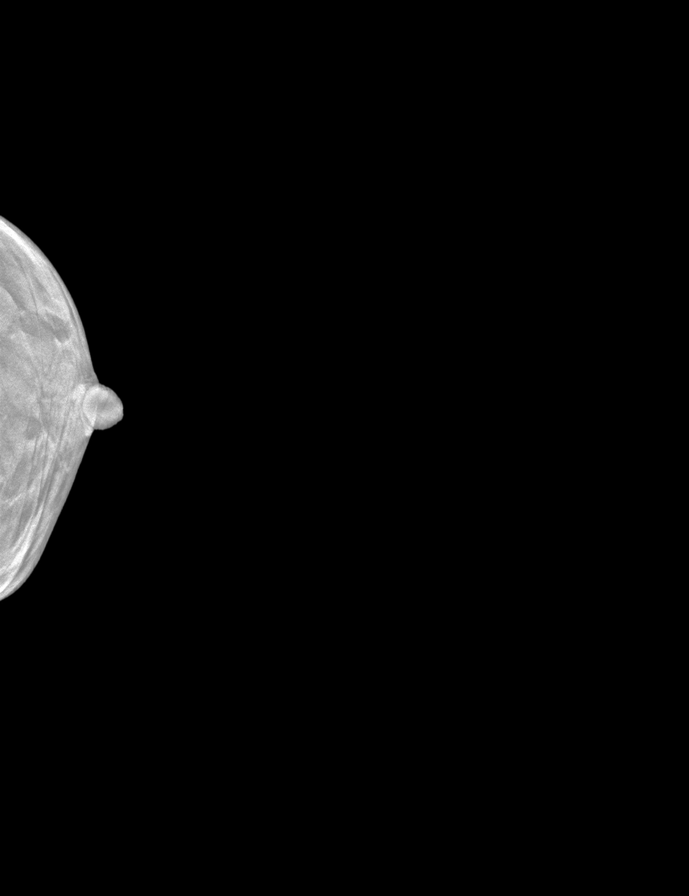

[L MLO synth-2D]
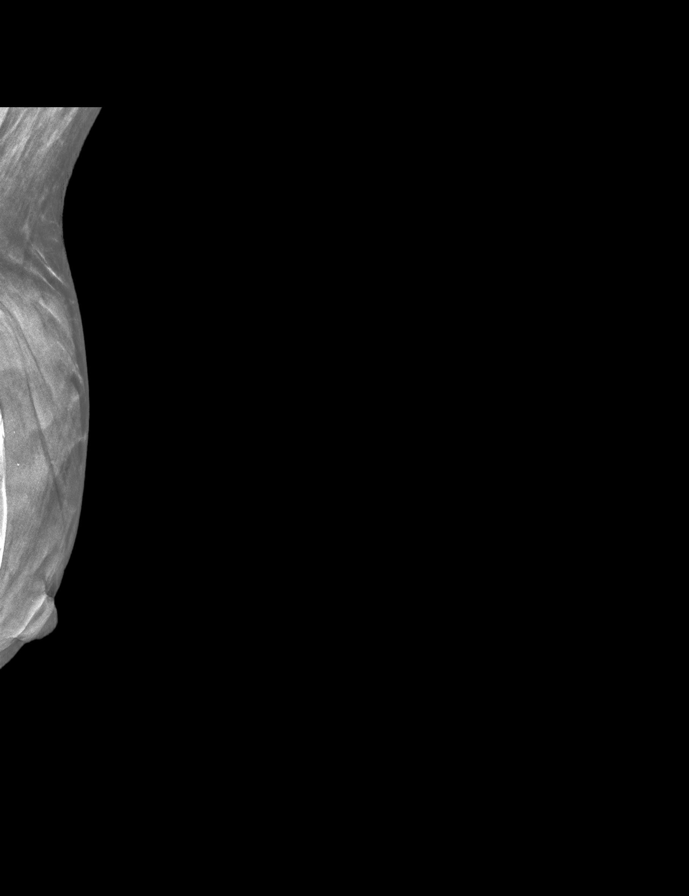

[R CC synth-2D]
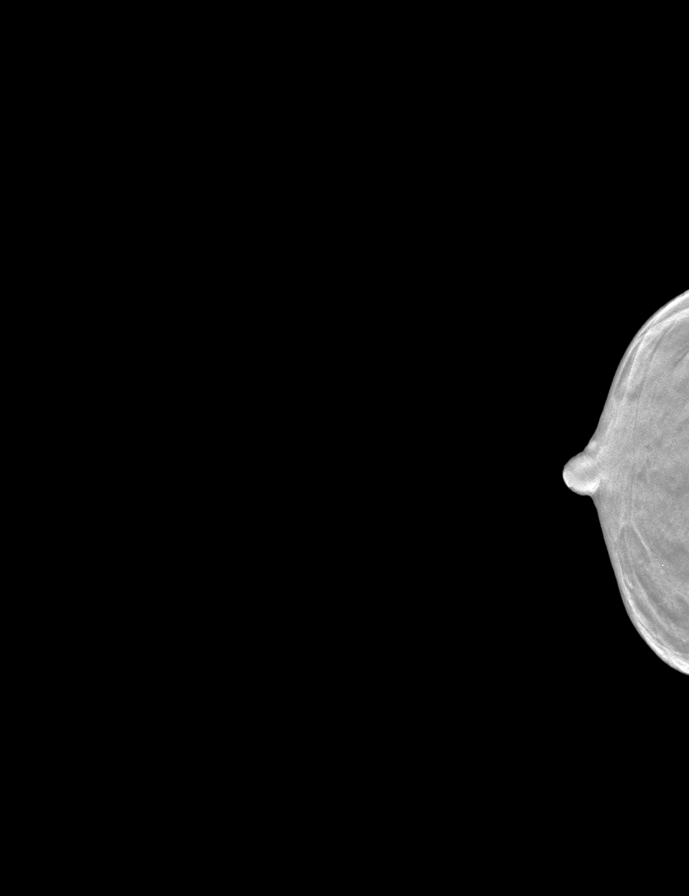

[R CCID BREAST TOMOSYNTHESIS IMAGE tomo · tomo slice 11/22.0]
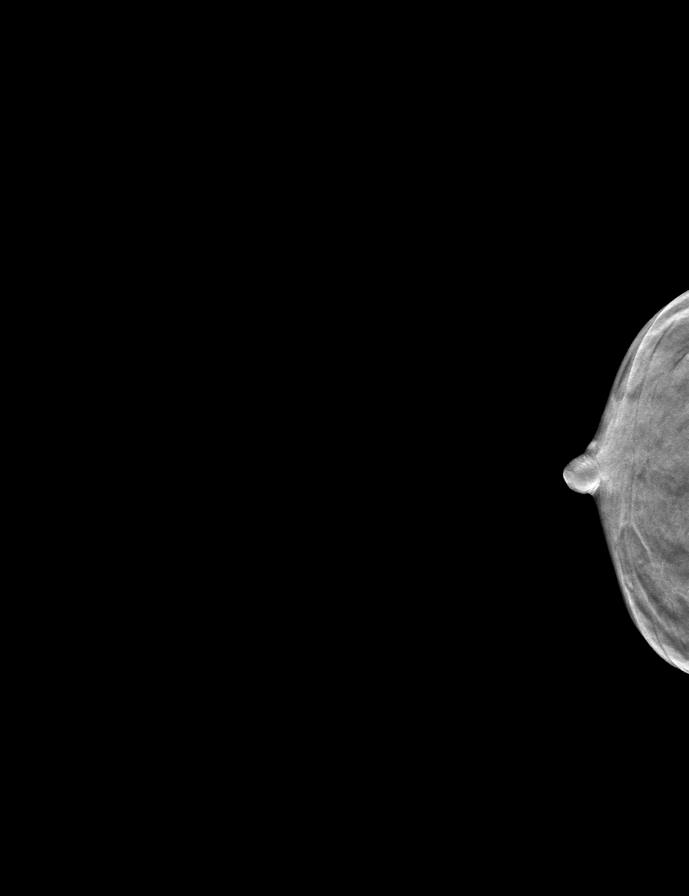

[9 of 28 positions shown; findings below may reference images not displayed]

ACR Breast Density Category d: The breast tissue is extremely dense,
which lowers the sensitivity of mammography.
FINDINGS: No suspicious masses or calcifications are seen in either breast.
Stable appearance of bilateral retropectoral saline implants. There
is no mammographic evidence of malignancy in either breast.

Mammographic images were processed with CAD.
IMPRESSION: No mammographic evidence of malignancy in either breast.

RECOMMENDATION:
1. Recommend further management of nonfocal left breast pain be
based on clinical assessment.

2.  Screening mammogram in one year.(Code:[id number]-D-6PA)

I have discussed the findings and recommendations with the patient.
If applicable, a reminder letter will be sent to the patient
regarding the next appointment.

BI-RADS CATEGORY  1: Negative.

## 2021-07-26 DIAGNOSIS — E559 Vitamin D deficiency, unspecified: Secondary | ICD-10-CM | POA: Diagnosis not present

## 2021-07-26 DIAGNOSIS — R1011 Right upper quadrant pain: Secondary | ICD-10-CM | POA: Diagnosis not present

## 2021-07-26 DIAGNOSIS — E785 Hyperlipidemia, unspecified: Secondary | ICD-10-CM | POA: Diagnosis not present

## 2021-07-26 DIAGNOSIS — R109 Unspecified abdominal pain: Secondary | ICD-10-CM | POA: Diagnosis not present

## 2021-07-26 DIAGNOSIS — K8020 Calculus of gallbladder without cholecystitis without obstruction: Secondary | ICD-10-CM | POA: Diagnosis not present

## 2021-07-26 DIAGNOSIS — K824 Cholesterolosis of gallbladder: Secondary | ICD-10-CM | POA: Diagnosis not present

## 2021-07-26 DIAGNOSIS — K760 Fatty (change of) liver, not elsewhere classified: Secondary | ICD-10-CM | POA: Diagnosis not present

## 2021-07-26 DIAGNOSIS — Z0000 Encounter for general adult medical examination without abnormal findings: Secondary | ICD-10-CM | POA: Diagnosis not present

## 2021-11-10 DIAGNOSIS — Z124 Encounter for screening for malignant neoplasm of cervix: Secondary | ICD-10-CM | POA: Diagnosis not present

## 2021-11-10 DIAGNOSIS — Z1231 Encounter for screening mammogram for malignant neoplasm of breast: Secondary | ICD-10-CM | POA: Diagnosis not present

## 2021-11-10 DIAGNOSIS — Z681 Body mass index (BMI) 19 or less, adult: Secondary | ICD-10-CM | POA: Diagnosis not present

## 2021-11-10 DIAGNOSIS — Z1151 Encounter for screening for human papillomavirus (HPV): Secondary | ICD-10-CM | POA: Diagnosis not present

## 2021-11-15 ENCOUNTER — Other Ambulatory Visit: Payer: Self-pay | Admitting: Obstetrics and Gynecology

## 2021-11-15 DIAGNOSIS — R928 Other abnormal and inconclusive findings on diagnostic imaging of breast: Secondary | ICD-10-CM

## 2021-11-28 ENCOUNTER — Other Ambulatory Visit: Payer: Self-pay | Admitting: Obstetrics and Gynecology

## 2021-11-28 ENCOUNTER — Ambulatory Visit
Admission: RE | Admit: 2021-11-28 | Discharge: 2021-11-28 | Disposition: A | Discharge status: Home / Self Care | Payer: PPO | Source: Ambulatory Visit | Attending: Obstetrics and Gynecology | Admitting: Obstetrics and Gynecology

## 2021-11-28 DIAGNOSIS — R928 Other abnormal and inconclusive findings on diagnostic imaging of breast: Secondary | ICD-10-CM

## 2021-11-28 DIAGNOSIS — R921 Mammographic calcification found on diagnostic imaging of breast: Secondary | ICD-10-CM

## 2021-12-15 DIAGNOSIS — L814 Other melanin hyperpigmentation: Secondary | ICD-10-CM | POA: Diagnosis not present

## 2021-12-15 DIAGNOSIS — L820 Inflamed seborrheic keratosis: Secondary | ICD-10-CM | POA: Diagnosis not present

## 2021-12-15 DIAGNOSIS — D225 Melanocytic nevi of trunk: Secondary | ICD-10-CM | POA: Diagnosis not present

## 2021-12-15 DIAGNOSIS — D492 Neoplasm of unspecified behavior of bone, soft tissue, and skin: Secondary | ICD-10-CM | POA: Diagnosis not present

## 2021-12-15 DIAGNOSIS — L821 Other seborrheic keratosis: Secondary | ICD-10-CM | POA: Diagnosis not present

## 2021-12-16 ENCOUNTER — Other Ambulatory Visit: Payer: Self-pay

## 2021-12-16 ENCOUNTER — Encounter (HOSPITAL_BASED_OUTPATIENT_CLINIC_OR_DEPARTMENT_OTHER): Payer: Self-pay | Admitting: *Deleted

## 2021-12-16 DIAGNOSIS — R739 Hyperglycemia, unspecified: Secondary | ICD-10-CM | POA: Diagnosis not present

## 2021-12-16 DIAGNOSIS — R1031 Right lower quadrant pain: Secondary | ICD-10-CM | POA: Diagnosis present

## 2021-12-16 DIAGNOSIS — R001 Bradycardia, unspecified: Secondary | ICD-10-CM | POA: Diagnosis not present

## 2021-12-16 DIAGNOSIS — K56600 Partial intestinal obstruction, unspecified as to cause: Principal | ICD-10-CM | POA: Insufficient documentation

## 2021-12-16 DIAGNOSIS — Z79899 Other long term (current) drug therapy: Secondary | ICD-10-CM | POA: Diagnosis not present

## 2021-12-16 DIAGNOSIS — R636 Underweight: Secondary | ICD-10-CM | POA: Insufficient documentation

## 2021-12-16 DIAGNOSIS — Z87891 Personal history of nicotine dependence: Secondary | ICD-10-CM | POA: Insufficient documentation

## 2021-12-16 DIAGNOSIS — R7401 Elevation of levels of liver transaminase levels: Secondary | ICD-10-CM | POA: Insufficient documentation

## 2021-12-16 LAB — LIPASE, BLOOD: Lipase: 30 U/L (ref 11–51)

## 2021-12-16 LAB — CBC
HCT: 44.2 % (ref 36.0–46.0)
Hemoglobin: 15.3 g/dL — ABNORMAL HIGH (ref 12.0–15.0)
MCH: 33.2 pg (ref 26.0–34.0)
MCHC: 34.6 g/dL (ref 30.0–36.0)
MCV: 95.9 fL (ref 80.0–100.0)
Platelets: 209 10*3/uL (ref 150–400)
RBC: 4.61 MIL/uL (ref 3.87–5.11)
RDW: 12.8 % (ref 11.5–15.5)
WBC: 6.5 10*3/uL (ref 4.0–10.5)
nRBC: 0 % (ref 0.0–0.2)

## 2021-12-16 LAB — URINALYSIS, ROUTINE W REFLEX MICROSCOPIC
Bilirubin Urine: NEGATIVE
Glucose, UA: NEGATIVE mg/dL
Hgb urine dipstick: NEGATIVE
Ketones, ur: NEGATIVE mg/dL
Leukocytes,Ua: NEGATIVE
Nitrite: NEGATIVE
Protein, ur: NEGATIVE mg/dL
Specific Gravity, Urine: 1.022 (ref 1.005–1.030)
pH: 6.5 (ref 5.0–8.0)

## 2021-12-16 LAB — COMPREHENSIVE METABOLIC PANEL
ALT: 71 U/L — ABNORMAL HIGH (ref 0–44)
AST: 38 U/L (ref 15–41)
Albumin: 4.8 g/dL (ref 3.5–5.0)
Alkaline Phosphatase: 60 U/L (ref 38–126)
Anion gap: 12 (ref 5–15)
BUN: 34 mg/dL — ABNORMAL HIGH (ref 8–23)
CO2: 28 mmol/L (ref 22–32)
Calcium: 9.3 mg/dL (ref 8.9–10.3)
Chloride: 97 mmol/L — ABNORMAL LOW (ref 98–111)
Creatinine, Ser: 0.85 mg/dL (ref 0.44–1.00)
GFR, Estimated: >60 mL/min (ref >60)
Glucose, Bld: 152 mg/dL — ABNORMAL HIGH (ref 70–99)
Potassium: 4.1 mmol/L (ref 3.5–5.1)
Sodium: 137 mmol/L (ref 135–145)
Total Bilirubin: 0.7 mg/dL (ref 0.3–1.2)
Total Protein: 7 g/dL (ref 6.5–8.1)

## 2021-12-16 NOTE — ED Triage Notes (Signed)
Sudden onset severe lower abdominal cramping today at 8pm followed by nausea and vomiting and dry heaving.  Pt denies any pain or burning with urination.

## 2021-12-17 ENCOUNTER — Emergency Department (HOSPITAL_BASED_OUTPATIENT_CLINIC_OR_DEPARTMENT_OTHER): Payer: PPO

## 2021-12-17 ENCOUNTER — Encounter (HOSPITAL_COMMUNITY): Payer: Self-pay

## 2021-12-17 ENCOUNTER — Observation Stay (HOSPITAL_BASED_OUTPATIENT_CLINIC_OR_DEPARTMENT_OTHER)
Admission: EM | Admit: 2021-12-17 | Discharge: 2021-12-19 | Disposition: A | Discharge status: Home / Self Care | Payer: PPO | Attending: Student | Admitting: Student

## 2021-12-17 DIAGNOSIS — R7989 Other specified abnormal findings of blood chemistry: Secondary | ICD-10-CM

## 2021-12-17 DIAGNOSIS — K56600 Partial intestinal obstruction, unspecified as to cause: Secondary | ICD-10-CM | POA: Diagnosis not present

## 2021-12-17 DIAGNOSIS — Z4682 Encounter for fitting and adjustment of non-vascular catheter: Secondary | ICD-10-CM | POA: Diagnosis not present

## 2021-12-17 DIAGNOSIS — Z87891 Personal history of nicotine dependence: Secondary | ICD-10-CM | POA: Diagnosis not present

## 2021-12-17 DIAGNOSIS — R001 Bradycardia, unspecified: Secondary | ICD-10-CM | POA: Diagnosis not present

## 2021-12-17 DIAGNOSIS — K56609 Unspecified intestinal obstruction, unspecified as to partial versus complete obstruction: Secondary | ICD-10-CM

## 2021-12-17 DIAGNOSIS — R7401 Elevation of levels of liver transaminase levels: Secondary | ICD-10-CM | POA: Diagnosis not present

## 2021-12-17 DIAGNOSIS — Z79899 Other long term (current) drug therapy: Secondary | ICD-10-CM | POA: Diagnosis not present

## 2021-12-17 DIAGNOSIS — R109 Unspecified abdominal pain: Secondary | ICD-10-CM | POA: Diagnosis not present

## 2021-12-17 DIAGNOSIS — R636 Underweight: Secondary | ICD-10-CM | POA: Diagnosis not present

## 2021-12-17 DIAGNOSIS — R739 Hyperglycemia, unspecified: Secondary | ICD-10-CM

## 2021-12-17 DIAGNOSIS — K219 Gastro-esophageal reflux disease without esophagitis: Secondary | ICD-10-CM

## 2021-12-17 DIAGNOSIS — K5900 Constipation, unspecified: Secondary | ICD-10-CM

## 2021-12-17 DIAGNOSIS — R1031 Right lower quadrant pain: Secondary | ICD-10-CM | POA: Diagnosis present

## 2021-12-17 LAB — RENAL FUNCTION PANEL
Albumin: 3.6 g/dL (ref 3.5–5.0)
Anion gap: 6 (ref 5–15)
BUN: 19 mg/dL (ref 8–23)
CO2: 27 mmol/L (ref 22–32)
Calcium: 8.8 mg/dL — ABNORMAL LOW (ref 8.9–10.3)
Chloride: 107 mmol/L (ref 98–111)
Creatinine, Ser: 0.71 mg/dL (ref 0.44–1.00)
GFR, Estimated: >60 mL/min (ref >60)
Glucose, Bld: 95 mg/dL (ref 70–99)
Phosphorus: 3.6 mg/dL (ref 2.5–4.6)
Potassium: 3.8 mmol/L (ref 3.5–5.1)
Sodium: 140 mmol/L (ref 135–145)

## 2021-12-17 LAB — CBC
HCT: 42.8 % (ref 36.0–46.0)
Hemoglobin: 14.5 g/dL (ref 12.0–15.0)
MCH: 33.3 pg (ref 26.0–34.0)
MCHC: 33.9 g/dL (ref 30.0–36.0)
MCV: 98.4 fL (ref 80.0–100.0)
Platelets: 179 10*3/uL (ref 150–400)
RBC: 4.35 MIL/uL (ref 3.87–5.11)
RDW: 12.9 % (ref 11.5–15.5)
WBC: 5.7 10*3/uL (ref 4.0–10.5)
nRBC: 0 % (ref 0.0–0.2)

## 2021-12-17 LAB — HEMOGLOBIN A1C
Hgb A1c MFr Bld: 4.9 % (ref 4.8–5.6)
Mean Plasma Glucose: 93.93 mg/dL

## 2021-12-17 MED ORDER — HYDROMORPHONE HCL 1 MG/ML IJ SOLN
0.5000 mg | Freq: Once | INTRAMUSCULAR | Status: AC
  Administered 2021-12-17: 0.5 mg via INTRAVENOUS
  Filled 2021-12-17: qty 1

## 2021-12-17 MED ORDER — DIATRIZOATE MEGLUMINE & SODIUM 66-10 % PO SOLN
90.0000 mL | Freq: Once | ORAL | Status: AC
  Administered 2021-12-17: 90 mL via NASOGASTRIC
  Filled 2021-12-17: qty 90

## 2021-12-17 MED ORDER — MORPHINE SULFATE (PF) 2 MG/ML IV SOLN
2.0000 mg | INTRAVENOUS | Status: DC | PRN
  Administered 2021-12-17: 2 mg via INTRAVENOUS
  Filled 2021-12-17: qty 1

## 2021-12-17 MED ORDER — BISACODYL 10 MG RE SUPP
10.0000 mg | Freq: Every day | RECTAL | Status: DC
  Administered 2021-12-17 – 2021-12-19 (×3): 10 mg via RECTAL
  Filled 2021-12-17 (×3): qty 1

## 2021-12-17 MED ORDER — PROCHLORPERAZINE EDISYLATE 10 MG/2ML IJ SOLN: 10.0000 mg | Freq: Four times a day (QID) | INTRAMUSCULAR | Status: DC | PRN

## 2021-12-17 MED ORDER — SODIUM CHLORIDE 0.9 % IV SOLN: INTRAVENOUS | Status: DC

## 2021-12-17 MED ORDER — ONDANSETRON HCL 4 MG/2ML IJ SOLN
4.0000 mg | Freq: Once | INTRAMUSCULAR | Status: AC
  Administered 2021-12-17: 4 mg via INTRAVENOUS
  Filled 2021-12-17: qty 2

## 2021-12-17 MED ORDER — IOHEXOL 300 MG/ML  SOLN
100.0000 mL | Freq: Once | INTRAMUSCULAR | Status: AC | PRN
  Administered 2021-12-17: 70 mL via INTRAVENOUS

## 2021-12-17 MED ORDER — SODIUM CHLORIDE 0.9 % IV BOLUS
1000.0000 mL | Freq: Once | INTRAVENOUS | Status: AC
  Administered 2021-12-17: 1000 mL via INTRAVENOUS

## 2021-12-17 NOTE — Progress Notes (Signed)
Transfer Accept Note  Mrs. Wassenaar is a 66 y.o. F with IBS, dyspepsia who presented with acute abdominal pain, vomiting.    In the ER, CT abdomen and pelvis showed significant constipation as well as partial small bowel obstruction.    NG tube was placed and given fluids.    Plan for Go-lytely per NG.  Given constipation is cause, General Surgery were not consulted.

## 2021-12-17 NOTE — Hospital Course (Signed)
Mrs. Shortridge is a 66 y.o. F with IBS, dyspepsia who presented with acute abdominal pain, vomiting.    In the ER, CT abdomen and pelvis showed significant constipation as well as partial small bowel obstruction.    NG tube was placed and given fluids.    To med surg.  Needs bowel regimen, likely Go-lytely per NG.  General Surgery not needed.

## 2021-12-17 NOTE — H&P (Addendum)
History and Physical    Patient: Sarah Haas XLK:[id number] DOB: 03/10/1956 DOA: 12/17/2021 DOS: the patient was seen and examined on 12/17/2021 PCP: Mila Palmer, MD  Patient coming from: Home  Chief Complaint:  Chief Complaint  Patient presents with   Abdominal Pain   HPI: Sarah Haas is a 66 y.o. female with medical history significant of GERD. Presenting with abdominal pain. She reports she was in her normal state of health until yesterday evening. After having some dinner, she reports that she had some periumbilical cramping. At first it was mild, but then it became more severe. She had an episode of N/V. She didn't have any fevers or sick contacts. When her symptoms worsened, she decided to come to the ED for assistance. She denies any other aggravating or alleviating factors.   Review of Systems: As mentioned in the history of present illness. All other systems reviewed and are negative. Past Medical History:  Diagnosis Date   Abdominal pain    GERD (gastroesophageal reflux disease)    Past Surgical History:  Procedure Laterality Date   AUGMENTATION MAMMAPLASTY Bilateral    1996-saline   CESAREAN SECTION     PLACEMENT OF BREAST IMPLANTS     REMOVAL OF BILATERAL TISSUE EXPANDERS WITH PLACEMENT OF BILATERAL BREAST IMPLANTS Bilateral    removed 2021   Social History:  reports that she quit smoking about 20 years ago. Her smoking use included cigarettes. She has never used smokeless tobacco. She reports current alcohol use. She reports that she does not use drugs.  No Known Allergies  Family History  Problem Relation Age of Onset   Heart disease Father     Prior to Admission medications   Medication Sig Start Date End Date Taking? Authorizing Provider  Calcium Carb-Cholecalciferol (CALTRATE 600+D3) 600-20 MG-MCG TABS Take 1 tablet by mouth daily.   Yes [provider]  Magnesium Gluconate 500 (27 Mg) MG TABS Take 500 mg by mouth daily.   Yes [provider]  Multiple Vitamin (MULTIVITAMIN) capsule Take 1 capsule by mouth daily.   Yes [provider]  Omega-3 Fatty Acids (FISH OIL) 1000 MG CAPS Take 1,000 mg by mouth daily.   Yes [provider]  Tavaborole 5 % SOLN Apply 1 Application topically daily. Apply to toenail 11/17/21  Yes [provider]    Physical Exam: Vitals:   12/17/21 1145 12/17/21 1236 12/17/21 1315 12/17/21 1317  BP: 108/67 111/70 108/61 108/61  Pulse: (!) 50 (!) 54 (!) 54 (!) 53  Resp: 13 13  16   Temp:   98.1 F (36.7 C) 98.1 F (36.7 C)  TempSrc:   Oral Oral  SpO2: 97% 96% 99% 98%  Weight:   48.9 kg   Height:   5' 3.5" (1.613 m)    General: 66 y.o. female resting in bed in NAD Eyes: PERRL, normal sclera ENMT: Nares patent w/o discharge, orophaynx clear, dentition normal, ears w/o discharge/lesions/ulcers Neck: Supple, trachea midline Cardiovascular: RRR, +S1, S2, no m/g/r, equal pulses throughout Respiratory: CTABL, no w/r/r, normal WOB GI: BS+, ND, mild periumbilical ttp, no masses noted, no organomegaly noted MSK: No e/c/c Neuro: A&O x 3, no focal deficits Psyc: Appropriate interaction and affect, calm/cooperative  Data Reviewed:  Results for orders placed or performed during the hospital encounter of 12/17/21 (from the past 24 hour(s))  Lipase, blood     Status: None   Collection Time: 12/16/21 11:15 PM  Result Value Ref Range   Lipase 30 11 - 51  U/L  Comprehensive metabolic panel     Status: Abnormal   Collection Time: 12/16/21 11:15 PM  Result Value Ref Range   Sodium 137 135 - 145 mmol/L   Potassium 4.1 3.5 - 5.1 mmol/L   Chloride 97 (L) 98 - 111 mmol/L   CO2 28 22 - 32 mmol/L   Glucose, Bld 152 (H) 70 - 99 mg/dL   BUN 34 (H) 8 - 23 mg/dL   Creatinine, Ser 7.82 0.44 - 1.00 mg/dL   Calcium 9.3 8.9 - 42.3 mg/dL   Total Protein 7.0 6.5 - 8.1 g/dL   Albumin 4.8 3.5 - 5.0 g/dL   AST 38 15 - 41 U/L   ALT 71 (H) 0 - 44 U/L   Alkaline Phosphatase 60 38 - 126  U/L   Total Bilirubin 0.7 0.3 - 1.2 mg/dL   GFR, Estimated >53 >61 mL/min   Anion gap 12 5 - 15  CBC     Status: Abnormal   Collection Time: 12/16/21 11:15 PM  Result Value Ref Range   WBC 6.5 4.0 - 10.5 K/uL   RBC 4.61 3.87 - 5.11 MIL/uL   Hemoglobin 15.3 (H) 12.0 - 15.0 g/dL   HCT 44.3 15.4 - 00.8 %   MCV 95.9 80.0 - 100.0 fL   MCH 33.2 26.0 - 34.0 pg   MCHC 34.6 30.0 - 36.0 g/dL   RDW 67.6 19.5 - 09.3 %   Platelets 209 150 - 400 K/uL   nRBC 0.0 0.0 - 0.2 %  Urinalysis, Routine w reflex microscopic     Status: None   Collection Time: 12/16/21 11:15 PM  Result Value Ref Range   Color, Urine YELLOW YELLOW   APPearance CLEAR CLEAR   Specific Gravity, Urine 1.022 1.005 - 1.030   pH 6.5 5.0 - 8.0   Glucose, UA NEGATIVE NEGATIVE mg/dL   Hgb urine dipstick NEGATIVE NEGATIVE   Bilirubin Urine NEGATIVE NEGATIVE   Ketones, ur NEGATIVE NEGATIVE mg/dL   Protein, ur NEGATIVE NEGATIVE mg/dL   Nitrite NEGATIVE NEGATIVE   Leukocytes,Ua NEGATIVE NEGATIVE   CT ab/pelvis Dilated stool filled distal small bowel with some thickening in the region of the terminal ileum. Additionally, considerable fecal material is noted within the colon in the right and transverse colons. This likely represents a combination of colonic constipation as well as partial small bowel obstruction distally related to the thickened terminal ileum and possibly additionally related to the colonic constipation. Distal colon is decompressed. No other focal abnormality is noted.  Assessment and Plan: Constipation pSBO     - placed in obs, med-surg     - NGT in place; start SBO protocol     - BM regimen     - fluids, anti-emetics, pain control     - General surgery to review her gastrografin studies; appreciate assistance  Hyperglycemia     - no history of DM     - check A1c  Elevated LFTs     - check hepatitis panel  Advance Care Planning:   Code Status: FULL  Consults: General Surgery  Family  Communication: None at bedside  Severity of Illness: The appropriate patient status for this patient is OBSERVATION. Observation status is judged to be reasonable and necessary in order to provide the required intensity of service to ensure the patient's safety. The patient's presenting symptoms, physical exam findings, and initial radiographic and laboratory data in the context of their medical condition is felt to place them at decreased risk  for further clinical deterioration. Furthermore, it is anticipated that the patient will be medically stable for discharge from the hospital within 2 midnights of admission.   Time spent in coordination of this H&P: 75 minutes  Author: Jonnie Finner, DO 12/17/2021 2:06 PM  For on call review [email or web address].

## 2021-12-17 NOTE — ED Provider Notes (Signed)
[facility] EMERGENCY DEPT Provider Note  CSN: [id number] Arrival date & time: 12/16/21 2259  Chief Complaint(s) Abdominal Pain  HPI Sarah Haas is a 66 y.o. female    The history is provided by the patient.  Abdominal Pain Pain location:  Periumbilical and RLQ Pain quality: cramping   Pain quality: not aching   Pain radiates to:  R flank Pain severity:  Moderate Onset quality:  Gradual Duration:  6 hours Timing:  Constant Progression:  Waxing and waning Chronicity:  New Relieved by:  Nothing Worsened by:  Movement and palpation Associated symptoms: nausea and vomiting   Associated symptoms: no belching, no constipation, no diarrhea, no fatigue and no fever     Past Medical History Past Medical History:  Diagnosis Date   Abdominal pain    GERD (gastroesophageal reflux disease)    There are no problems to display for this patient.  Home Medication(s) Prior to Admission medications   Medication Sig Start Date End Date Taking? Authorizing Provider  esomeprazole (NEXIUM) 40 MG capsule Take 1 capsule (40 mg total) by mouth daily before breakfast. Patient not taking: Reported on 05/08/2019 03/01/12   Mardella Layman, MD  hyoscyamine (LEVSIN SL) 0.125 MG SL tablet Place 1 tablet (0.125 mg total) under the tongue every 4 (four) hours as needed for cramping. Patient not taking: Reported on 05/08/2019 03/01/12   Mardella Layman, MD                                                                                                                                    Allergies Patient has no known allergies.  Review of Systems Review of Systems  Constitutional:  Negative for fatigue and fever.  Gastrointestinal:  Positive for abdominal pain, nausea and vomiting. Negative for constipation and diarrhea.   As noted in HPI  Physical Exam Vital Signs  I have reviewed the triage vital signs BP (!) 90/55   Pulse 65   Temp 98.4 F (36.9 C)   Resp 18   SpO2 96%    Physical Exam Vitals reviewed.  Constitutional:      General: She is not in acute distress.    Appearance: She is well-developed. She is not diaphoretic.  HENT:     Head: Normocephalic and atraumatic.     Right Ear: External ear normal.     Left Ear: External ear normal.     Nose: Nose normal.  Eyes:     General: No scleral icterus.    Conjunctiva/sclera: Conjunctivae normal.  Neck:     Trachea: Phonation normal.  Cardiovascular:     Rate and Rhythm: Normal rate and regular rhythm.  Pulmonary:     Effort: Pulmonary effort is normal. No respiratory distress.     Breath sounds: No stridor.  Abdominal:     General: There is no distension.     Tenderness: There is abdominal tenderness in the right lower  quadrant, periumbilical area and suprapubic area. There is no guarding or rebound.     Hernia: No hernia is present.  Musculoskeletal:        General: Normal range of motion.     Cervical back: Normal range of motion.  Neurological:     Mental Status: She is alert and oriented to person, place, and time.  Psychiatric:        Behavior: Behavior normal.     ED Results and Treatments Labs (all labs ordered are listed, but only abnormal results are displayed) Labs Reviewed  COMPREHENSIVE METABOLIC PANEL - Abnormal; Notable for the following components:      Result Value   Chloride 97 (*)    Glucose, Bld 152 (*)    BUN 34 (*)    ALT 71 (*)    All other components within normal limits  CBC - Abnormal; Notable for the following components:   Hemoglobin 15.3 (*)    All other components within normal limits  LIPASE, BLOOD  URINALYSIS, ROUTINE W REFLEX MICROSCOPIC                                                                                                                         EKG  EKG Interpretation  Date/Time:    Ventricular Rate:    PR Interval:    QRS Duration:   QT Interval:    QTC Calculation:   R Axis:     Text Interpretation:         Radiology CT  ABDOMEN PELVIS W CONTRAST  Result Date: 12/17/2021 CLINICAL DATA:  Abdominal pain EXAM: CT ABDOMEN AND PELVIS WITH CONTRAST TECHNIQUE: Multidetector CT imaging of the abdomen and pelvis was performed using the standard protocol following bolus administration of intravenous contrast. RADIATION DOSE REDUCTION: This exam was performed according to the departmental dose-optimization program which includes automated exposure control, adjustment of the mA and/or kV according to patient size and/or use of iterative reconstruction technique. CONTRAST:  31mL OMNIPAQUE IOHEXOL 300 MG/ML  SOLN COMPARISON:  None Available. FINDINGS: Lower chest: Lung bases are free of acute infiltrate or sizable effusion. Hepatobiliary: No focal liver abnormality is seen. No gallstones, gallbladder wall thickening, or biliary dilatation. Pancreas: Unremarkable. No pancreatic ductal dilatation or surrounding inflammatory changes. Spleen: Normal in size without focal abnormality. Adrenals/Urinary Tract: Adrenal glands are within normal limits. Kidneys demonstrate a normal enhancement pattern bilaterally. Normal excretion is noted. No renal calculi or obstructive changes are seen. The bladder is partially distended. Stomach/Bowel: Stomach is distended with ingested food stuffs. The distal colon is decompressed however a considerable amount of fecal material is noted within the right colon and transverse colon with evidence of fecalized bowel contents within the distal small bowel. No discrete obstructing lesion is noted although some mild thickening of the terminal ileal wall is noted. This is best visualized on axial image 358 of series 3 and on the coronal reconstructions image number 29 of series 5. The more proximal small bowel  is within normal limits. The appendix is within normal limits. Vascular/Lymphatic: No significant vascular findings are present. No enlarged abdominal or pelvic lymph nodes. Reproductive: Uterus and bilateral adnexa  are unremarkable. Other: No abdominal wall hernia or abnormality. No abdominopelvic ascites. Musculoskeletal: No acute or significant osseous findings. IMPRESSION: Dilated stool filled distal small bowel with some thickening in the region of the terminal ileum. Additionally, considerable fecal material is noted within the colon in the right and transverse colons. This likely represents a combination of colonic constipation as well as partial small bowel obstruction distally related to the thickened terminal ileum and possibly additionally related to the colonic constipation. Distal colon is decompressed. No other focal abnormality is noted. Electronically Signed   By: Inez Catalina M.D.   On: 12/17/2021 04:04    Medications Ordered in ED Medications  ondansetron (ZOFRAN) injection 4 mg (4 mg Intravenous Given 12/17/21 0209)  sodium chloride 0.9 % bolus 1,000 mL (1,000 mLs Intravenous New Bag/Given 12/17/21 0207)  HYDROmorphone (DILAUDID) injection 0.5 mg (0.5 mg Intravenous Given 12/17/21 0210)  iohexol (OMNIPAQUE) 300 MG/ML solution 100 mL (70 mLs Intravenous Contrast Given 12/17/21 0218)                                                                                                                                     Procedures Procedures  (including critical care time)  Medical Decision Making / ED Course   Medical Decision Making Amount and/or Complexity of Data Reviewed Labs: ordered. Decision-making details documented in ED Course. Radiology: ordered and independent interpretation performed. Decision-making details documented in ED Course.  Risk Prescription drug management. Parenteral controlled substances. Decision regarding hospitalization.    Sudden onset abdominal pain with nausea and vomiting. Will assess for any intra-abdominal inflammatory/infectious process or bowel obstruction. Given age unlikely pregnancy related process. Will assess for urinary tract infection and renal  stone.  CBC without leukocytosis or anemia. Metabolic panel without significant electrolyte derangements or renal sufficiency. No evidence of bili obstruction or pancreatitis. UA not consistent with a urinary tract infection.  CT of the abdomen notable for likely partial small bowel obstruction with colonic constipation. NG tube placed We will admit to medicine for small bowel obstruction protocol.      Final Clinical Impression(s) / ED Diagnoses Final diagnoses:  Partial small bowel obstruction ([place])           This chart was dictated using voice recognition software.  Despite best efforts to proofread,  errors can occur which can change the documentation meaning.    Fatima Blank, MD 12/17/21 [phone]

## 2021-12-18 ENCOUNTER — Observation Stay (HOSPITAL_COMMUNITY): Payer: PPO

## 2021-12-18 DIAGNOSIS — Z4682 Encounter for fitting and adjustment of non-vascular catheter: Secondary | ICD-10-CM | POA: Diagnosis not present

## 2021-12-18 DIAGNOSIS — R001 Bradycardia, unspecified: Secondary | ICD-10-CM | POA: Diagnosis not present

## 2021-12-18 DIAGNOSIS — R636 Underweight: Secondary | ICD-10-CM | POA: Diagnosis not present

## 2021-12-18 DIAGNOSIS — R7989 Other specified abnormal findings of blood chemistry: Secondary | ICD-10-CM

## 2021-12-18 DIAGNOSIS — R739 Hyperglycemia, unspecified: Secondary | ICD-10-CM

## 2021-12-18 DIAGNOSIS — K56609 Unspecified intestinal obstruction, unspecified as to partial versus complete obstruction: Secondary | ICD-10-CM | POA: Diagnosis not present

## 2021-12-18 DIAGNOSIS — K5900 Constipation, unspecified: Secondary | ICD-10-CM

## 2021-12-18 DIAGNOSIS — Z87891 Personal history of nicotine dependence: Secondary | ICD-10-CM | POA: Diagnosis not present

## 2021-12-18 DIAGNOSIS — K219 Gastro-esophageal reflux disease without esophagitis: Secondary | ICD-10-CM

## 2021-12-18 DIAGNOSIS — Z79899 Other long term (current) drug therapy: Secondary | ICD-10-CM | POA: Diagnosis not present

## 2021-12-18 DIAGNOSIS — K56600 Partial intestinal obstruction, unspecified as to cause: Secondary | ICD-10-CM | POA: Diagnosis not present

## 2021-12-18 DIAGNOSIS — R7401 Elevation of levels of liver transaminase levels: Secondary | ICD-10-CM | POA: Diagnosis not present

## 2021-12-18 LAB — CBC
HCT: 44.3 % (ref 36.0–46.0)
Hemoglobin: 14.9 g/dL (ref 12.0–15.0)
MCH: 33.9 pg (ref 26.0–34.0)
MCHC: 33.6 g/dL (ref 30.0–36.0)
MCV: 100.7 fL — ABNORMAL HIGH (ref 80.0–100.0)
Platelets: 174 10*3/uL (ref 150–400)
RBC: 4.4 MIL/uL (ref 3.87–5.11)
RDW: 13.2 % (ref 11.5–15.5)
WBC: 6.5 10*3/uL (ref 4.0–10.5)
nRBC: 0 % (ref 0.0–0.2)

## 2021-12-18 LAB — COMPREHENSIVE METABOLIC PANEL
ALT: 48 U/L — ABNORMAL HIGH (ref 0–44)
AST: 26 U/L (ref 15–41)
Albumin: 3.5 g/dL (ref 3.5–5.0)
Alkaline Phosphatase: 48 U/L (ref 38–126)
Anion gap: 4 — ABNORMAL LOW (ref 5–15)
BUN: 14 mg/dL (ref 8–23)
CO2: 25 mmol/L (ref 22–32)
Calcium: 8.3 mg/dL — ABNORMAL LOW (ref 8.9–10.3)
Chloride: 110 mmol/L (ref 98–111)
Creatinine, Ser: 0.72 mg/dL (ref 0.44–1.00)
GFR, Estimated: >60 mL/min (ref >60)
Glucose, Bld: 95 mg/dL (ref 70–99)
Potassium: 4 mmol/L (ref 3.5–5.1)
Sodium: 139 mmol/L (ref 135–145)
Total Bilirubin: 1.3 mg/dL — ABNORMAL HIGH (ref 0.3–1.2)
Total Protein: 5.3 g/dL — ABNORMAL LOW (ref 6.5–8.1)

## 2021-12-18 LAB — HEPATITIS PANEL, ACUTE
HCV Ab: NONREACTIVE
Hep A IgM: NONREACTIVE
Hep B C IgM: NONREACTIVE
Hepatitis B Surface Ag: NONREACTIVE

## 2021-12-18 LAB — C-REACTIVE PROTEIN: CRP: <0.5 mg/dL (ref <1.0)

## 2021-12-18 LAB — HIV ANTIBODY (ROUTINE TESTING W REFLEX): HIV Screen 4th Generation wRfx: NONREACTIVE

## 2021-12-18 MED ORDER — KETOROLAC TROMETHAMINE 15 MG/ML IJ SOLN
15.0000 mg | Freq: Once | INTRAMUSCULAR | Status: AC
  Administered 2021-12-18: 15 mg via INTRAVENOUS
  Filled 2021-12-18 (×2): qty 1

## 2021-12-18 MED ORDER — PANTOPRAZOLE SODIUM 40 MG IV SOLR
40.0000 mg | Freq: Every day | INTRAVENOUS | Status: DC
  Administered 2021-12-18 – 2021-12-19 (×2): 40 mg via INTRAVENOUS
  Filled 2021-12-18 (×2): qty 10

## 2021-12-18 MED ORDER — POLYETHYLENE GLYCOL 3350 17 G PO PACK
17.0000 g | PACK | Freq: Two times a day (BID) | ORAL | Status: DC
  Administered 2021-12-18 – 2021-12-19 (×3): 17 g via ORAL
  Filled 2021-12-18 (×3): qty 1

## 2021-12-18 NOTE — Progress Notes (Addendum)
PROGRESS NOTE  Sarah Haas QBV:[id number] DOB: May 15, 1955   PCP: Jonathon Jordan, MD  Patient is from: Home.  Lives with husband.  Independently ambulates at baseline  DOA: 12/17/2021 LOS: 0  Chief complaints Chief Complaint  Patient presents with   Abdominal Pain     Brief Narrative / Interim history: 66 year old F with PMH of GERD presenting with abdominal pain with associated nausea and vomiting.  Vitals and labs without significant finding.  CT abdomen and pelvis showed dilated stool-filled distal small bowel with some thickening in the region of terminal ileum raising concern for partial SBO and colonic constipation.  She had NG tube placed.  General surgery consulted.   Last colonoscopy 10 years ago, and reportedly normal.  She is due for repeat colonoscopy.  The next day, KUB negative for SBO.  Surgery recommended discontinuing NGT and starting p.o.  Subjective: Seen and examined earlier this morning.  No major events overnight of this morning.  Minimal output from NG tube.  Denies abdominal pain.  No bowel or flatus yet.  Denies UTI symptoms.  Objective: Vitals:   12/17/21 1747 12/17/21 2045 12/18/21 0045 12/18/21 0537  BP: 118/74 120/74 115/72 106/62  Pulse: (!) 51 (!) 53 (!) 51 (!) 50  Resp: _0 Temp: 98.6 F (37 C) 98.7 F (37.1 C) 98.6 F (37 C) 97.8 F (36.6 C)  TempSrc: Oral Oral Oral Oral  SpO2: 99% 97% 98% 96%  Weight:      Height:        Examination:  GENERAL: No apparent distress.  Nontoxic. HEENT: MMM.  Vision and hearing grossly intact.  NGT in place. NECK: Supple.  No apparent JVD.  RESP:  No IWOB.  Fair aeration bilaterally. CVS: Bradycardic to 50s but regular rhythm.  Heart sounds normal.  ABD/GI/GU: BS++. Abd soft, NTND.  MSK/EXT:  Moves extremities. No apparent deformity. No edema.  SKIN: no apparent skin lesion or wound NEURO: Awake, alert and oriented appropriately.  No apparent focal neuro deficit. PSYCH: Calm. Normal affect.    Procedures:  Nasogastric tube insertion  Microbiology summarized: None  Assessment and plan: Principal Problem:   Partial small bowel obstruction (HCC) Active Problems:   Constipation   Hyperglycemia   Elevated LFTs   Hyperbilirubinemia   GERD (gastroesophageal reflux disease)  Possible partial SBO/constipation: Presents with abdominal pain and associated nausea and vomiting. CT abdomen and pelvis showed dilated stool-filled distal small bowel with some thickening in the region of terminal ileum raising concern for partial SBO and colonic constipation.  No prior surgery.  Abdominal pain resolved but no bowel or gas yet.  Minimal output from NG tube.  No SBO on KUB this morning. -Appreciate input by general surgery-okay to discontinue NGT and start p.o. -Clamp NG tube.  Start clear liquid diet.  Can d/c NG tube and advance to FLD this afternoon if she tolerates -Mobilize patient -Continue IV fluids and Dulcolax suppository -Check CRP and ESR -Last colonoscopy 10 years ago.  Reportedly normal.  Outpatient follow-up with GI  Sinus bradycardia: HR in 50s at rest.  Prior EKG NSR in 60s in 2021.  Not on nodal blocking agents or Aricept. -Check twelve-lead EKG -Avoid nodal blocking agents  GERD?  Not on medication at home. -PPI  Mildly elevated ALT/mild hyperbilirubinemia-unclear etiology.  Acute hepatitis panel and lipase negative.  No gallbladder, biliary or pancreatic abnormality on CT. -Continue trending  Underweight Body mass index is 18.8 kg/m. -Consult dietitian when she started eating formal  diet.           DVT prophylaxis:  SCDs Start: 12/17/21 1518  Code Status: Full code Family Communication: Updated patient's husband at bedside Level of care: Med-Surg Status is: Observation The patient will require care spanning > 2 midnights and should be moved to inpatient because: Possible small bowel obstruction/constipation   Final disposition: Home once medically  cleared Consultants:  General surgery  Sch Meds:  Scheduled Meds:  bisacodyl  10 mg Rectal Daily   pantoprazole (PROTONIX) IV  40 mg Intravenous Daily   Continuous Infusions:  sodium chloride 100 mL/hr at 12/18/21 1238   PRN Meds:.morphine injection, prochlorperazine  Antimicrobials: Anti-infectives (From admission, onward)    None        I have personally reviewed the following labs and images: CBC: Recent Labs  Lab 12/16/21 2315 12/17/21 1706 12/18/21 0537  WBC 6.5 5.7 6.5  HGB 15.3* 14.5 14.9  HCT 44.2 42.8 44.3  MCV 95.9 98.4 100.7*  PLT 209 179 174   BMP &GFR Recent Labs  Lab 12/16/21 2315 12/17/21 1706 12/18/21 0537  NA 137 140 139  K 4.1 3.8 4.0  CL 97* 107 110  CO2 _0 GLUCOSE 152* 95 95  BUN 34* 19 14  CREATININE 0.85 0.71 0.72  CALCIUM 9.3 8.8* 8.3*  PHOS  --  3.6  --    Estimated Creatinine Clearance: 54.1 mL/min (by C-G formula based on SCr of 0.72 mg/dL). Liver & Pancreas: Recent Labs  Lab 12/16/21 2315 12/17/21 1706 12/18/21 0537  AST 38  --  26  ALT 71*  --  48*  ALKPHOS 60  --  48  BILITOT 0.7  --  1.3*  PROT 7.0  --  5.3*  ALBUMIN 4.8 3.6 3.5   Recent Labs  Lab 12/16/21 2315  LIPASE 30   No results for input(s): "AMMONIA" in the last 168 hours. Diabetic: Recent Labs    12/17/21 1706  HGBA1C 4.9   No results for input(s): "GLUCAP" in the last 168 hours. Cardiac Enzymes: No results for input(s): "CKTOTAL", "CKMB", "CKMBINDEX", "TROPONINI" in the last 168 hours. No results for input(s): "PROBNP" in the last 8760 hours. Coagulation Profile: No results for input(s): "INR", "PROTIME" in the last 168 hours. Thyroid Function Tests: No results for input(s): "TSH", "T4TOTAL", "FREET4", "T3FREE", "THYROIDAB" in the last 72 hours. Lipid Profile: No results for input(s): "CHOL", "HDL", "LDLCALC", "TRIG", "CHOLHDL", "LDLDIRECT" in the last 72 hours. Anemia Panel: No results for input(s): "VITAMINB12", "FOLATE",  "FERRITIN", "TIBC", "IRON", "RETICCTPCT" in the last 72 hours. Urine analysis:    Component Value Date/Time   COLORURINE YELLOW 12/16/2021 2315   APPEARANCEUR CLEAR 12/16/2021 2315   LABSPEC 1.022 12/16/2021 2315   PHURINE 6.5 12/16/2021 2315   GLUCOSEU NEGATIVE 12/16/2021 2315   HGBUR NEGATIVE 12/16/2021 2315   BILIRUBINUR NEGATIVE 12/16/2021 2315   KETONESUR NEGATIVE 12/16/2021 2315   PROTEINUR NEGATIVE 12/16/2021 2315   NITRITE NEGATIVE 12/16/2021 2315   LEUKOCYTESUR NEGATIVE 12/16/2021 2315   Sepsis Labs: Invalid input(s): "PROCALCITONIN", "LACTICIDVEN"  Microbiology: No results found for this or any previous visit (from the past 240 hour(s)).  Radiology Studies: DG Abd Portable 1V-Small Bowel Obstruction Protocol-initial, 8 hr delay  Result Date: 12/18/2021 CLINICAL DATA:  8 hour delay to assess for small bowel obstruction EXAM: PORTABLE ABDOMEN - 1 VIEW COMPARISON:  CT abdomen and pelvis 12/17/2021 FINDINGS: Enteric contrast is seen reaching the distal sigmoid colon. Gas is present within the rectum. Partially visualized enteric  tube. No acute osseous abnormality. IMPRESSION: Negative for small-bowel obstruction. Electronically Signed   By: Placido Sou M.D.   On: 12/18/2021 01:40      Dorthea Maina T. [place]  If 7PM-7AM, please contact night-coverage [email or web address] 12/18/2021, 1:06 PM

## 2021-12-18 NOTE — Plan of Care (Signed)
  Problem: Education: Goal: Knowledge of General Education information will improve Description: Including pain rating scale, medication(s)/side effects and non-pharmacologic comfort measures Outcome: Progressing   Problem: Activity: Goal: Risk for activity intolerance will decrease Outcome: Progressing   

## 2021-12-18 NOTE — Progress Notes (Signed)
Patient ID: Sarah Haas, female   DOB: 1955/06/02, 66 y.o.   MRN: [id number]   Surgery was asked to review the small bowel protocol films.  They are normal with contrast going easily into the distal colon with no evidence of SBO. From our standpoint, she can have her NG out and can start PO. Let us know if you want a formal consult

## 2021-12-19 DIAGNOSIS — R739 Hyperglycemia, unspecified: Secondary | ICD-10-CM | POA: Diagnosis not present

## 2021-12-19 DIAGNOSIS — K219 Gastro-esophageal reflux disease without esophagitis: Secondary | ICD-10-CM | POA: Diagnosis not present

## 2021-12-19 DIAGNOSIS — K56600 Partial intestinal obstruction, unspecified as to cause: Secondary | ICD-10-CM | POA: Diagnosis not present

## 2021-12-19 DIAGNOSIS — R7989 Other specified abnormal findings of blood chemistry: Secondary | ICD-10-CM | POA: Diagnosis not present

## 2021-12-19 DIAGNOSIS — K5900 Constipation, unspecified: Secondary | ICD-10-CM | POA: Diagnosis not present

## 2021-12-19 LAB — COMPREHENSIVE METABOLIC PANEL
ALT: 46 U/L — ABNORMAL HIGH (ref 0–44)
AST: 27 U/L (ref 15–41)
Albumin: 3.6 g/dL (ref 3.5–5.0)
Alkaline Phosphatase: 52 U/L (ref 38–126)
Anion gap: 6 (ref 5–15)
BUN: 12 mg/dL (ref 8–23)
CO2: 26 mmol/L (ref 22–32)
Calcium: 8.5 mg/dL — ABNORMAL LOW (ref 8.9–10.3)
Chloride: 108 mmol/L (ref 98–111)
Creatinine, Ser: 0.79 mg/dL (ref 0.44–1.00)
GFR, Estimated: >60 mL/min (ref >60)
Glucose, Bld: 99 mg/dL (ref 70–99)
Potassium: 3.8 mmol/L (ref 3.5–5.1)
Sodium: 140 mmol/L (ref 135–145)
Total Bilirubin: 1.4 mg/dL — ABNORMAL HIGH (ref 0.3–1.2)
Total Protein: 5.8 g/dL — ABNORMAL LOW (ref 6.5–8.1)

## 2021-12-19 LAB — CBC
HCT: 47.4 % — ABNORMAL HIGH (ref 36.0–46.0)
Hemoglobin: 15.4 g/dL — ABNORMAL HIGH (ref 12.0–15.0)
MCH: 33.1 pg (ref 26.0–34.0)
MCHC: 32.5 g/dL (ref 30.0–36.0)
MCV: 101.9 fL — ABNORMAL HIGH (ref 80.0–100.0)
Platelets: 189 10*3/uL (ref 150–400)
RBC: 4.65 MIL/uL (ref 3.87–5.11)
RDW: 13 % (ref 11.5–15.5)
WBC: 6.1 10*3/uL (ref 4.0–10.5)
nRBC: 0 % (ref 0.0–0.2)

## 2021-12-19 LAB — SEDIMENTATION RATE: Sed Rate: 9 mm/hr (ref 0–22)

## 2021-12-19 LAB — PHOSPHORUS: Phosphorus: 3.7 mg/dL (ref 2.5–4.6)

## 2021-12-19 LAB — MAGNESIUM: Magnesium: 2.2 mg/dL (ref 1.7–2.4)

## 2021-12-19 MED ORDER — SENNOSIDES-DOCUSATE SODIUM 8.6-50 MG PO TABS: 1.0000 | ORAL_TABLET | Freq: Two times a day (BID) | ORAL | 0 refills | Status: AC | PRN

## 2021-12-19 MED ORDER — POLYETHYLENE GLYCOL 3350 17 GM/SCOOP PO POWD: 17.0000 g | Freq: Two times a day (BID) | ORAL | 1 refills | Status: AC | PRN

## 2021-12-19 NOTE — Progress Notes (Signed)
  Transition of Care [facility]) Screening Note   Patient Details  Name: Sarah Haas Date of Birth: [birth year]   Transition of Care [facility]) CM/SW Contact:    Lennart Pall, LCSW Phone Number: 12/19/2021, 9:36 AM    Transition of Care Department [facility]) has reviewed patient and no TOC needs have been identified at this time. We will continue to monitor patient advancement through interdisciplinary progression rounds. If new patient transition needs arise, please place a TOC consult.

## 2021-12-19 NOTE — Discharge Summary (Signed)
Physician Discharge Summary  Ben Avon VFI:[id number] DOB: December 11, 1955 DOA: 12/17/2021  PCP: Jonathon Jordan, MD  Admit date: 12/17/2021 Discharge date: 12/19/2021 Admitted From: Home Disposition: Home Recommendations for Outpatient Follow-up:  Follow up with PCP in 1 to 2 weeks Recommend outpatient follow-up with gastroenterology for colonoscopy.  She is due for repeat Please follow up on the following pending results: None  Home Health: Not indicated Equipment/Devices: Not indicated  Discharge Condition: Stable CODE STATUS: Full code  Follow-up Information     Jonathon Jordan, MD. Schedule an appointment as soon as possible for a visit in 1 week(s).   Specialty: Family Medicine Contact information: [place] [id number] [facility] course 66 year old F with PMH of GERD presenting with abdominal pain with associated nausea and vomiting.  Vitals and labs without significant finding.  CT abdomen and pelvis showed dilated stool-filled distal small bowel with some thickening in the region of terminal ileum raising concern for partial SBO and colonic constipation.  She had NG tube placed.  General surgery consulted.    Last colonoscopy 10 years ago, and reportedly normal.  She is due for repeat colonoscopy.   The next day, SBO protocol KUB negative for SBO.  Surgery recommended discontinuing NGT and starting p.o. NGT clamp.  She was started on clear liquid diet.  On the day of discharge, NGT removed.  She tolerated soft diet and had bowel movements.  CRP and ESR negative.    See individual problem list below for more.   Problems addressed during this hospitalization Principal Problem:   Partial small bowel obstruction (HCC) Active Problems:   Constipation   Hyperglycemia   Elevated LFTs   Hyperbilirubinemia   GERD (gastroesophageal reflux disease)   Possible partial SBO/constipation: Presents with  abdominal pain and associated nausea and vomiting. CT abdomen and pelvis showed dilated stool-filled distal small bowel with some thickening in the region of terminal ileum raising concern for partial SBO and colonic constipation.  No prior surgery.  Abdominal pain resolved but no bowel or gas yet.  Minimal output from NG tube.  No SBO on KUB the following morning.  General surgery recommended discontinuing NGT and starting p.o.  NGT clamped.  Started on CLD and bowel regimen. On the day of discharge, had bowel movements.  ESR and CRP negative.  NG tube discontinued.  She tolerated soft diet. -Discharged on MiraLAX and Senokot-S as needed -She is due for repeat colonoscopy.   Sinus bradycardia: HR in 50s at rest but appropriate chronotropic response to exertion.  Not symptomatic.  Not on nodal blocking agents or Aricept.   GERD?  Not on medication at home.   Mildly elevated ALT/mild hyperbilirubinemia-unclear etiology.  Acute hepatitis panel and lipase negative.  No gallbladder, biliary or pancreatic abnormality on CT. no further nausea or vomiting or pain. -Recheck at follow-up   Underweight Body mass index is 18.8 kg/m.  -Monitor -Colonoscopy    Vital signs Vitals:   12/18/21 0537 12/18/21 1356 12/18/21 2008 12/19/21 0514  BP: 106/62 111/69 121/74 139/85  Pulse: (!) 50 (!) 47 (!) 56 (!) 58  Temp: 97.8 F (36.6 C) 98.5 F (36.9 C) 98.4 F (36.9 C) 98.5 F (36.9 C)  Resp: _0 Height:      Weight:      SpO2: 96% 97% 98% 99%  TempSrc: Oral Oral Oral Oral  BMI (Calculated):         Discharge exam  GENERAL: No apparent distress.  Nontoxic. HEENT: MMM.  Vision and hearing grossly intact.  NECK: Supple.  No apparent JVD.  RESP:  No IWOB.  Fair aeration bilaterally. CVS: Slightly bradycardic.  Heart sounds normal.  ABD/GI/GU: BS+. Abd soft, NTND.  MSK/EXT:  Moves extremities. No apparent deformity. No edema.  SKIN: no apparent skin lesion or wound NEURO: Awake and  alert. Oriented appropriately.  No apparent focal neuro deficit. PSYCH: Calm. Normal affect.   Discharge Instructions Discharge Instructions     Call MD for:  persistant nausea and vomiting   Complete by: As directed    Call MD for:  severe uncontrolled pain   Complete by: As directed    Diet general   Complete by: As directed    Continue soft diet for the next 2 to 3 days.   Discharge instructions   Complete by: As directed    It has been a pleasure taking care of you!  You were hospitalized due to abdominal pain, nausea and vomiting.  Your CT raised concern for bowel obstruction and possible constipation.  However, your symptoms resolved and your subsequent x-ray did not show further bowel obstruction.  We are discharging you on MiraLAX and/or Senokot-S as needed for constipation.  Recommend follow-up with gastroenterology.    Take care,   Increase activity slowly   Complete by: As directed       Allergies as of 12/19/2021   No Known Allergies      Medication List     TAKE these medications    Caltrate 600+D3 600-20 MG-MCG Tabs Generic drug: Calcium Carb-Cholecalciferol Take 1 tablet by mouth daily.   Fish Oil 1000 MG Caps Take 1,000 mg by mouth daily.   Magnesium Gluconate 500 (27 Mg) MG Tabs Take 500 mg by mouth daily.   multivitamin capsule Take 1 capsule by mouth daily.   polyethylene glycol powder 17 GM/SCOOP powder Commonly known as: MiraLax Take 17 g by mouth 2 (two) times daily as needed for mild constipation.   senna-docusate 8.6-50 MG tablet Commonly known as: Senokot-S Take 1 tablet by mouth 2 (two) times daily between meals as needed for moderate constipation.   Tavaborole 5 % Soln Apply 1 Application topically daily. Apply to toenail        Consultations: General surgery  Procedures/Studies: Nasogastric tube insertion   DG Abd Portable 1V-Small Bowel Obstruction Protocol-initial, 8 hr delay  Result Date: 12/18/2021 CLINICAL DATA:   8 hour delay to assess for small bowel obstruction EXAM: PORTABLE ABDOMEN - 1 VIEW COMPARISON:  CT abdomen and pelvis 12/17/2021 FINDINGS: Enteric contrast is seen reaching the distal sigmoid colon. Gas is present within the rectum. Partially visualized enteric tube. No acute osseous abnormality. IMPRESSION: Negative for small-bowel obstruction. Electronically Signed   By: Placido Sou M.D.   On: 12/18/2021 01:40   DG Chest Portable 1 View  Result Date: 12/17/2021 CLINICAL DATA:  Nasogastric tube placement EXAM: PORTABLE CHEST 1 VIEW COMPARISON:  05/07/2019 FINDINGS: Enteric tube which loops in the stomach, tip and side-port over the stomach. Stable bowel gas pattern compared to preceding CT. Clear lung bases. IMPRESSION: Enteric tube placement with tip and side port at the stomach. Electronically Signed   By: Jorje Guild M.D.   On: 12/17/2021 06:23   CT ABDOMEN PELVIS W CONTRAST  Result Date: 12/17/2021 CLINICAL DATA:  Abdominal pain EXAM: CT ABDOMEN AND PELVIS WITH CONTRAST TECHNIQUE: Multidetector  CT imaging of the abdomen and pelvis was performed using the standard protocol following bolus administration of intravenous contrast. RADIATION DOSE REDUCTION: This exam was performed according to the departmental dose-optimization program which includes automated exposure control, adjustment of the mA and/or kV according to patient size and/or use of iterative reconstruction technique. CONTRAST:  36m OMNIPAQUE IOHEXOL 300 MG/ML  SOLN COMPARISON:  None Available. FINDINGS: Lower chest: Lung bases are free of acute infiltrate or sizable effusion. Hepatobiliary: No focal liver abnormality is seen. No gallstones, gallbladder wall thickening, or biliary dilatation. Pancreas: Unremarkable. No pancreatic ductal dilatation or surrounding inflammatory changes. Spleen: Normal in size without focal abnormality. Adrenals/Urinary Tract: Adrenal glands are within normal limits. Kidneys demonstrate a normal  enhancement pattern bilaterally. Normal excretion is noted. No renal calculi or obstructive changes are seen. The bladder is partially distended. Stomach/Bowel: Stomach is distended with ingested food stuffs. The distal colon is decompressed however a considerable amount of fecal material is noted within the right colon and transverse colon with evidence of fecalized bowel contents within the distal small bowel. No discrete obstructing lesion is noted although some mild thickening of the terminal ileal wall is noted. This is best visualized on axial image 358 of series 3 and on the coronal reconstructions image number 29 of series 5. The more proximal small bowel is within normal limits. The appendix is within normal limits. Vascular/Lymphatic: No significant vascular findings are present. No enlarged abdominal or pelvic lymph nodes. Reproductive: Uterus and bilateral adnexa are unremarkable. Other: No abdominal wall hernia or abnormality. No abdominopelvic ascites. Musculoskeletal: No acute or significant osseous findings. IMPRESSION: Dilated stool filled distal small bowel with some thickening in the region of the terminal ileum. Additionally, considerable fecal material is noted within the colon in the right and transverse colons. This likely represents a combination of colonic constipation as well as partial small bowel obstruction distally related to the thickened terminal ileum and possibly additionally related to the colonic constipation. Distal colon is decompressed. No other focal abnormality is noted. Electronically Signed   By: MInez CatalinaM.D.   On: 12/17/2021 04:04   MM Digital Diagnostic Unilat L  Result Date: 11/28/2021 CLINICAL DATA:  Patient recalled from screening for left breast calcifications. EXAM: DIGITAL DIAGNOSTIC UNILATERAL LEFT MAMMOGRAM WITH CAD TECHNIQUE: Left digital diagnostic mammography was performed. COMPARISON:  Previous exam(s). ACR Breast Density Category c: The breast tissue  is heterogeneously dense, which may obscure small masses. FINDINGS: Magnification views of the left breast were obtained. There is a 3 mm group of calcifications within the anterior inner left breast which appear to layer on the true lateral view suggestive of milk of calcium. IMPRESSION: Likely milk of calcium within the lower inner left breast. RECOMMENDATION: Left breast diagnostic mammogram with magnification views in 6 months. I have discussed the findings and recommendations with the patient. If applicable, a reminder letter will be sent to the patient regarding the next appointment. BI-RADS CATEGORY  3: Probably benign. Electronically Signed   By: DLovey NewcomerM.D.   On: 11/28/2021 13:31      The results of significant diagnostics from this hospitalization (including imaging, microbiology, ancillary and laboratory) are listed below for reference.     Microbiology: No results found for this or any previous visit (from the past 240 hour(s)).   Labs:  CBC: Recent Labs  Lab 12/16/21 2315 12/17/21 1706 12/18/21 0537 12/19/21 0315  WBC 6.5 5.7 6.5 6.1  HGB 15.3* 14.5 14.9 15.4*  HCT 44.2 42.8  44.3 47.4*  MCV 95.9 98.4 100.7* 101.9*  PLT 209 179 174 189   BMP &GFR Recent Labs  Lab 12/16/21 2315 12/17/21 1706 12/18/21 0537 12/19/21 0315  NA 137 140 139 140  K 4.1 3.8 4.0 3.8  CL 97* 107 110 108  CO2 _0 GLUCOSE 152* 95 95 99  BUN 34* _1 CREATININE 0.85 0.71 0.72 0.79  CALCIUM 9.3 8.8* 8.3* 8.5*  MG  --   --   --  2.2  PHOS  --  3.6  --  3.7   Estimated Creatinine Clearance: 54.1 mL/min (by C-G formula based on SCr of 0.79 mg/dL). Liver & Pancreas: Recent Labs  Lab 12/16/21 2315 12/17/21 1706 12/18/21 0537 12/19/21 0315  AST 38  --  26 27  ALT 71*  --  48* 46*  ALKPHOS 60  --  48 52  BILITOT 0.7  --  1.3* 1.4*  PROT 7.0  --  5.3* 5.8*  ALBUMIN 4.8 3.6 3.5 3.6   Recent Labs  Lab 12/16/21 2315  LIPASE 30   No results for input(s): "AMMONIA"  in the last 168 hours. Diabetic: Recent Labs    12/17/21 1706  HGBA1C 4.9   No results for input(s): "GLUCAP" in the last 168 hours. Cardiac Enzymes: No results for input(s): "CKTOTAL", "CKMB", "CKMBINDEX", "TROPONINI" in the last 168 hours. No results for input(s): "PROBNP" in the last 8760 hours. Coagulation Profile: No results for input(s): "INR", "PROTIME" in the last 168 hours. Thyroid Function Tests: No results for input(s): "TSH", "T4TOTAL", "FREET4", "T3FREE", "THYROIDAB" in the last 72 hours. Lipid Profile: No results for input(s): "CHOL", "HDL", "LDLCALC", "TRIG", "CHOLHDL", "LDLDIRECT" in the last 72 hours. Anemia Panel: No results for input(s): "VITAMINB12", "FOLATE", "FERRITIN", "TIBC", "IRON", "RETICCTPCT" in the last 72 hours. Urine analysis:    Component Value Date/Time   COLORURINE YELLOW 12/16/2021 2315   APPEARANCEUR CLEAR 12/16/2021 2315   LABSPEC 1.022 12/16/2021 2315   PHURINE 6.5 12/16/2021 2315   GLUCOSEU NEGATIVE 12/16/2021 2315   HGBUR NEGATIVE 12/16/2021 2315   BILIRUBINUR NEGATIVE 12/16/2021 2315   KETONESUR NEGATIVE 12/16/2021 2315   PROTEINUR NEGATIVE 12/16/2021 2315   NITRITE NEGATIVE 12/16/2021 2315   LEUKOCYTESUR NEGATIVE 12/16/2021 2315   Sepsis Labs: Invalid input(s): "PROCALCITONIN", "LACTICIDVEN"   SIGNED:  Mercy Riding, MD  [facility] 12/19/2021, 4:12 PM

## 2021-12-19 NOTE — Progress Notes (Signed)
Mobility Specialist - Progress Note   12/19/21 0945  Mobility  Activity Ambulated independently in hallway  Activity Response Tolerated well  Distance Ambulated (ft) 850 ft  $Mobility charge 1 Mobility  Level of Assistance Independent  Assistive Device None  HOB Elevated/Bed Position Self regulated  Range of Motion/Exercises Active  Transport method Ambulatory  Mobility Referral No   Pt received in bed and agreeable to mobility. No complaints during ambulation. Pt to bed after session with all needs met.    [facility]

## 2021-12-22 DIAGNOSIS — R0981 Nasal congestion: Secondary | ICD-10-CM | POA: Diagnosis not present

## 2021-12-22 DIAGNOSIS — Z681 Body mass index (BMI) 19 or less, adult: Secondary | ICD-10-CM | POA: Diagnosis not present

## 2021-12-22 DIAGNOSIS — J3489 Other specified disorders of nose and nasal sinuses: Secondary | ICD-10-CM | POA: Diagnosis not present

## 2021-12-22 DIAGNOSIS — R051 Acute cough: Secondary | ICD-10-CM | POA: Diagnosis not present

## 2021-12-22 DIAGNOSIS — U071 COVID-19: Secondary | ICD-10-CM | POA: Diagnosis not present

## 2021-12-26 DIAGNOSIS — K5669 Other partial intestinal obstruction: Secondary | ICD-10-CM | POA: Diagnosis not present

## 2021-12-26 DIAGNOSIS — U071 COVID-19: Secondary | ICD-10-CM | POA: Diagnosis not present

## 2021-12-26 DIAGNOSIS — Z8719 Personal history of other diseases of the digestive system: Secondary | ICD-10-CM | POA: Diagnosis not present

## 2022-04-03 ENCOUNTER — Encounter: Payer: Self-pay | Admitting: Gastroenterology

## 2022-05-30 ENCOUNTER — Ambulatory Visit
Admission: RE | Admit: 2022-05-30 | Discharge: 2022-05-30 | Disposition: A | Discharge status: Home / Self Care | Payer: PPO | Source: Ambulatory Visit | Attending: Obstetrics and Gynecology | Admitting: Obstetrics and Gynecology

## 2022-05-30 ENCOUNTER — Other Ambulatory Visit: Payer: Self-pay | Admitting: Obstetrics and Gynecology

## 2022-05-30 DIAGNOSIS — R921 Mammographic calcification found on diagnostic imaging of breast: Secondary | ICD-10-CM | POA: Diagnosis not present

## 2022-06-06 ENCOUNTER — Ambulatory Visit
Admission: RE | Admit: 2022-06-06 | Discharge: 2022-06-06 | Disposition: A | Discharge status: Home / Self Care | Payer: PPO | Source: Ambulatory Visit | Attending: Obstetrics and Gynecology | Admitting: Obstetrics and Gynecology

## 2022-06-06 DIAGNOSIS — R921 Mammographic calcification found on diagnostic imaging of breast: Secondary | ICD-10-CM

## 2022-06-06 HISTORY — PX: BREAST BIOPSY: SHX20

## 2022-08-01 DIAGNOSIS — E785 Hyperlipidemia, unspecified: Secondary | ICD-10-CM | POA: Diagnosis not present

## 2022-08-01 DIAGNOSIS — M8588 Other specified disorders of bone density and structure, other site: Secondary | ICD-10-CM | POA: Diagnosis not present

## 2022-08-01 DIAGNOSIS — E46 Unspecified protein-calorie malnutrition: Secondary | ICD-10-CM | POA: Diagnosis not present

## 2022-08-01 DIAGNOSIS — Z0000 Encounter for general adult medical examination without abnormal findings: Secondary | ICD-10-CM | POA: Diagnosis not present

## 2022-08-01 DIAGNOSIS — Z8719 Personal history of other diseases of the digestive system: Secondary | ICD-10-CM | POA: Diagnosis not present

## 2022-08-01 DIAGNOSIS — Z681 Body mass index (BMI) 19 or less, adult: Secondary | ICD-10-CM | POA: Diagnosis not present

## 2022-08-01 DIAGNOSIS — E559 Vitamin D deficiency, unspecified: Secondary | ICD-10-CM | POA: Diagnosis not present

## 2022-08-01 DIAGNOSIS — R7401 Elevation of levels of liver transaminase levels: Secondary | ICD-10-CM | POA: Diagnosis not present

## 2022-09-18 DIAGNOSIS — E785 Hyperlipidemia, unspecified: Secondary | ICD-10-CM | POA: Diagnosis not present

## 2022-09-18 DIAGNOSIS — Z681 Body mass index (BMI) 19 or less, adult: Secondary | ICD-10-CM | POA: Diagnosis not present

## 2022-09-18 DIAGNOSIS — E559 Vitamin D deficiency, unspecified: Secondary | ICD-10-CM | POA: Diagnosis not present

## 2022-09-18 DIAGNOSIS — Z713 Dietary counseling and surveillance: Secondary | ICD-10-CM | POA: Diagnosis not present

## 2022-09-18 DIAGNOSIS — R7401 Elevation of levels of liver transaminase levels: Secondary | ICD-10-CM | POA: Diagnosis not present

## 2022-10-04 DIAGNOSIS — E7800 Pure hypercholesterolemia, unspecified: Secondary | ICD-10-CM | POA: Diagnosis not present

## 2022-10-04 DIAGNOSIS — R7401 Elevation of levels of liver transaminase levels: Secondary | ICD-10-CM | POA: Diagnosis not present

## 2022-10-05 DIAGNOSIS — R7401 Elevation of levels of liver transaminase levels: Secondary | ICD-10-CM | POA: Diagnosis not present

## 2022-10-05 DIAGNOSIS — E7800 Pure hypercholesterolemia, unspecified: Secondary | ICD-10-CM | POA: Diagnosis not present

## 2022-12-11 DIAGNOSIS — Z681 Body mass index (BMI) 19 or less, adult: Secondary | ICD-10-CM | POA: Diagnosis not present

## 2022-12-11 DIAGNOSIS — Z1231 Encounter for screening mammogram for malignant neoplasm of breast: Secondary | ICD-10-CM | POA: Diagnosis not present

## 2022-12-11 DIAGNOSIS — Z01419 Encounter for gynecological examination (general) (routine) without abnormal findings: Secondary | ICD-10-CM | POA: Diagnosis not present

## 2022-12-13 ENCOUNTER — Other Ambulatory Visit: Payer: Self-pay | Admitting: Obstetrics and Gynecology

## 2022-12-13 DIAGNOSIS — R928 Other abnormal and inconclusive findings on diagnostic imaging of breast: Secondary | ICD-10-CM

## 2022-12-20 DIAGNOSIS — Z08 Encounter for follow-up examination after completed treatment for malignant neoplasm: Secondary | ICD-10-CM | POA: Diagnosis not present

## 2022-12-20 DIAGNOSIS — L814 Other melanin hyperpigmentation: Secondary | ICD-10-CM | POA: Diagnosis not present

## 2022-12-20 DIAGNOSIS — D225 Melanocytic nevi of trunk: Secondary | ICD-10-CM | POA: Diagnosis not present

## 2022-12-20 DIAGNOSIS — Z85828 Personal history of other malignant neoplasm of skin: Secondary | ICD-10-CM | POA: Diagnosis not present

## 2022-12-20 DIAGNOSIS — L821 Other seborrheic keratosis: Secondary | ICD-10-CM | POA: Diagnosis not present

## 2022-12-22 ENCOUNTER — Other Ambulatory Visit: Payer: Self-pay | Admitting: Obstetrics and Gynecology

## 2022-12-22 ENCOUNTER — Ambulatory Visit
Admission: RE | Admit: 2022-12-22 | Discharge: 2022-12-22 | Disposition: A | Discharge status: Home / Self Care | Payer: PPO | Source: Ambulatory Visit | Attending: Obstetrics and Gynecology | Admitting: Obstetrics and Gynecology

## 2022-12-22 DIAGNOSIS — R921 Mammographic calcification found on diagnostic imaging of breast: Secondary | ICD-10-CM

## 2022-12-22 DIAGNOSIS — R928 Other abnormal and inconclusive findings on diagnostic imaging of breast: Secondary | ICD-10-CM

## 2023-01-12 ENCOUNTER — Ambulatory Visit: Payer: PPO | Admitting: Gastroenterology

## 2023-06-04 DIAGNOSIS — M8580 Other specified disorders of bone density and structure, unspecified site: Secondary | ICD-10-CM | POA: Diagnosis not present

## 2023-07-18 ENCOUNTER — Encounter

## 2023-07-20 DIAGNOSIS — Z681 Body mass index (BMI) 19 or less, adult: Secondary | ICD-10-CM | POA: Diagnosis not present

## 2023-07-20 DIAGNOSIS — Z0000 Encounter for general adult medical examination without abnormal findings: Secondary | ICD-10-CM | POA: Diagnosis not present

## 2023-07-20 DIAGNOSIS — E559 Vitamin D deficiency, unspecified: Secondary | ICD-10-CM | POA: Diagnosis not present

## 2023-07-20 DIAGNOSIS — Z1211 Encounter for screening for malignant neoplasm of colon: Secondary | ICD-10-CM | POA: Diagnosis not present

## 2023-07-20 DIAGNOSIS — E785 Hyperlipidemia, unspecified: Secondary | ICD-10-CM | POA: Diagnosis not present

## 2023-07-24 DIAGNOSIS — Z1211 Encounter for screening for malignant neoplasm of colon: Secondary | ICD-10-CM | POA: Diagnosis not present

## 2023-07-30 ENCOUNTER — Ambulatory Visit
Admission: RE | Admit: 2023-07-30 | Discharge: 2023-07-30 | Disposition: A | Discharge status: Home / Self Care | Source: Ambulatory Visit | Attending: Obstetrics and Gynecology | Admitting: Obstetrics and Gynecology

## 2023-07-30 DIAGNOSIS — R921 Mammographic calcification found on diagnostic imaging of breast: Secondary | ICD-10-CM | POA: Diagnosis not present

## 2023-09-13 DIAGNOSIS — K635 Polyp of colon: Secondary | ICD-10-CM | POA: Diagnosis not present

## 2023-09-13 DIAGNOSIS — Z1211 Encounter for screening for malignant neoplasm of colon: Secondary | ICD-10-CM | POA: Diagnosis not present

## 2023-09-17 DIAGNOSIS — K635 Polyp of colon: Secondary | ICD-10-CM | POA: Diagnosis not present

## 2023-12-12 DIAGNOSIS — Z01419 Encounter for gynecological examination (general) (routine) without abnormal findings: Secondary | ICD-10-CM | POA: Diagnosis not present

## 2023-12-12 DIAGNOSIS — Z681 Body mass index (BMI) 19 or less, adult: Secondary | ICD-10-CM | POA: Diagnosis not present

## 2023-12-24 DIAGNOSIS — D225 Melanocytic nevi of trunk: Secondary | ICD-10-CM | POA: Diagnosis not present

## 2023-12-24 DIAGNOSIS — L821 Other seborrheic keratosis: Secondary | ICD-10-CM | POA: Diagnosis not present

## 2023-12-24 DIAGNOSIS — L814 Other melanin hyperpigmentation: Secondary | ICD-10-CM | POA: Diagnosis not present

## 2024-01-14 ENCOUNTER — Other Ambulatory Visit: Payer: Self-pay | Admitting: Obstetrics and Gynecology

## 2024-01-14 DIAGNOSIS — R921 Mammographic calcification found on diagnostic imaging of breast: Secondary | ICD-10-CM

## 2024-01-22 ENCOUNTER — Ambulatory Visit
Admission: RE | Admit: 2024-01-22 | Discharge: 2024-01-22 | Disposition: A | Discharge status: Home / Self Care | Source: Ambulatory Visit | Attending: Obstetrics and Gynecology | Admitting: Obstetrics and Gynecology

## 2024-01-22 DIAGNOSIS — R921 Mammographic calcification found on diagnostic imaging of breast: Secondary | ICD-10-CM

## 2024-01-22 DIAGNOSIS — R928 Other abnormal and inconclusive findings on diagnostic imaging of breast: Secondary | ICD-10-CM | POA: Diagnosis not present
